# Patient Record
Sex: Female | Born: 1980 | Race: Black or African American | Hispanic: No | Marital: Single | Smoking: Never smoker
Health system: Southern US, Community
[De-identification: ages and names within clinical notes are randomized; demographics above are authoritative.]

## PROBLEM LIST (undated history)

## (undated) DIAGNOSIS — D571 Sickle-cell disease without crisis: Secondary | ICD-10-CM

## (undated) DIAGNOSIS — I639 Cerebral infarction, unspecified: Secondary | ICD-10-CM

---

## 2007-11-23 LAB — RETICULOCYTE COUNT: Reticulocyte count: 10.5 % — ABNORMAL HIGH (ref 0.5–1.5)

## 2007-11-24 LAB — BLOOD TYPE, (ABO+RH)
ABO/Rh(D): A POS
ABO/Rh: A POS

## 2007-11-24 LAB — BETA HCG, QT
Beta HCG, QT: 18061 m[IU]/mL — ABNORMAL HIGH (ref 0–6)
hCG Quant: 18061 m[IU]/mL — ABNORMAL HIGH (ref 0–6)

## 2007-11-24 LAB — CBC WITH AUTOMATED DIFF
ABS. BASOPHILS: 0 10*3/uL (ref 0.0–0.1)
ABS. EOSINOPHILS: 0.1 10*3/uL (ref 0.0–0.4)
ABS. LYMPHOCYTES: 4.8 10*3/uL — ABNORMAL HIGH (ref 0.8–3.5)
ABS. MONOCYTES: 1.4 10*3/uL — ABNORMAL HIGH (ref 0–1.0)
ABS. NEUTROPHILS: 6.2 10*3/uL (ref 1.8–8.0)
BASOPHILS: 0 % (ref 0–1)
EOSINOPHILS: 1 % (ref 0–7)
HCT: 20.7 % — ABNORMAL LOW (ref 35.0–47.0)
HGB: 6.9 g/dL — CL (ref 11.5–16.0)
LYMPHOCYTES: 38 % (ref 12–49)
MCH: 31.1 PG (ref 26.0–34.0)
MCHC: 33.3 g/dL (ref 30.0–35.0)
MCV: 93.2 FL (ref 80.0–99.0)
MONOCYTES: 11 % (ref 5–13)
NEUTROPHILS: 50 % (ref 32–75)
PLATELET: 661 10*3/uL — ABNORMAL HIGH (ref 150–400)
RBC: 2.22 M/uL — ABNORMAL LOW (ref 3.80–5.20)
RDW: 17.3 % — ABNORMAL HIGH (ref 11.5–14.5)
WBC: 12.5 10*3/uL — ABNORMAL HIGH (ref 3.6–11.0)

## 2007-11-24 LAB — METABOLIC PANEL, COMPREHENSIVE
A-G Ratio: 1 — ABNORMAL LOW (ref 1.1–2.2)
ALT (SGPT): 43 U/L (ref 30–65)
AST (SGOT): 43 U/L — ABNORMAL HIGH (ref 15–37)
Albumin: 4.4 g/dL (ref 3.5–5.0)
Alk. phosphatase: 60 U/L (ref 50–136)
Anion gap: 10 (ref 5–15)
BUN/Creatinine ratio: 12 (ref 12–20)
BUN: 6 MG/DL (ref 6–20)
Bilirubin, total: 3.6 MG/DL — ABNORMAL HIGH (ref 0–1.0)
CO2: 26 MMOL/L (ref 21–32)
Calcium: 9.1 MG/DL (ref 8.5–10.1)
Chloride: 102 MMOL/L (ref 97–108)
Creatinine: 0.5 MG/DL — ABNORMAL LOW (ref 0.6–1.3)
GFR est AA: >60 mL/min/{1.73_m2} (ref >60)
GFR est non-AA: >60 mL/min/{1.73_m2} (ref >60)
Globulin: 4.3 g/dL — ABNORMAL HIGH (ref 2.0–4.0)
Glucose: 89 MG/DL (ref 65–105)
Potassium: 3.7 MMOL/L (ref 3.5–5.1)
Protein, total: 8.7 g/dL — ABNORMAL HIGH (ref 6.4–8.2)
Sodium: 138 MMOL/L (ref 136–145)

## 2007-11-24 LAB — KOH, OTHER SOURCES: KOH: NONE SEEN

## 2007-11-24 LAB — URINALYSIS W/MICROSCOPIC
Bilirubin: NEGATIVE
Glucose: NEGATIVE MG/DL
Ketone: NEGATIVE MG/DL
Nitrites: NEGATIVE
Protein: NEGATIVE MG/DL
Specific gravity: 1.011 (ref 1.003–1.030)
Urobilinogen: 1 EU/DL (ref 0.2–1.0)
pH (UA): 7 (ref 5.0–8.0)

## 2007-11-24 LAB — WET PREP
Clue cells: ABSENT
Wet prep: NONE SEEN

## 2007-11-24 LAB — TYPE + CROSSMATCH
ABO/Rh(D): A POS
Antibody screen: NEGATIVE
Physician instructions: NEGATIVE

## 2007-11-24 LAB — PROTHROMBIN TIME + INR
INR: 1.2 (ref 0.9–1.2)
Prothrombin time: 13.1 SECS (ref 9.5–13.1)

## 2007-11-24 LAB — PTT, ACTIVATED: aPTT: 30.1 s (ref 26.5–37.3)

## 2007-11-24 LAB — HCG QL SERUM: HCG, Ql.: POSITIVE — AB

## 2016-07-27 ENCOUNTER — Emergency Department: Admit: 2016-07-27 | Payer: MEDICARE | Primary: Internal Medicine

## 2016-07-27 ENCOUNTER — Inpatient Hospital Stay
Admit: 2016-07-27 | Discharge: 2016-07-28 | Disposition: A | Discharge status: Home / Self Care | Payer: MEDICARE | Attending: Emergency Medicine

## 2016-07-27 DIAGNOSIS — T148 Other injury of unspecified body region: Secondary | ICD-10-CM

## 2016-07-27 LAB — CBC WITH AUTOMATED DIFF
ABS. BASOPHILS: 0 10*3/uL (ref 0.0–0.1)
ABS. EOSINOPHILS: 0.1 10*3/uL (ref 0.0–0.4)
ABS. LYMPHOCYTES: 1.9 10*3/uL (ref 0.8–3.5)
ABS. MONOCYTES: 1.3 10*3/uL — ABNORMAL HIGH (ref 0.0–1.0)
ABS. NEUTROPHILS: 6.2 10*3/uL (ref 1.8–8.0)
BASOPHILS: 0 % (ref 0–1)
EOSINOPHILS: 2 % (ref 0–7)
HCT: 24.8 % — ABNORMAL LOW (ref 35.0–47.0)
HGB: 8.4 g/dL — ABNORMAL LOW (ref 11.5–16.0)
LYMPHOCYTES: 20 % (ref 12–49)
MCH: 30.1 PG (ref 26.0–34.0)
MCHC: 33.9 g/dL (ref 30.0–36.5)
MCV: 88.9 FL (ref 80.0–99.0)
MONOCYTES: 13 % (ref 5–13)
NEUTROPHILS: 65 % (ref 32–75)
PLATELET: 584 10*3/uL — ABNORMAL HIGH (ref 150–400)
RBC: 2.79 M/uL — ABNORMAL LOW (ref 3.80–5.20)
RDW: 16.2 % — ABNORMAL HIGH (ref 11.5–14.5)
WBC: 9.6 10*3/uL (ref 3.6–11.0)

## 2016-07-27 LAB — HCG URINE, QL. - POC: Pregnancy test,urine (POC): NEGATIVE

## 2016-07-27 MED ORDER — SODIUM CHLORIDE 0.9% BOLUS IV
0.9 % | Freq: Once | INTRAVENOUS | Status: AC
Start: 2016-07-27 — End: 2016-07-27
  Administered 2016-07-27: via INTRAVENOUS

## 2016-07-27 MED ORDER — KETOROLAC TROMETHAMINE 30 MG/ML INJECTION
30 mg/mL (1 mL) | INTRAMUSCULAR | Status: AC
Start: 2016-07-27 — End: 2016-07-27
  Administered 2016-07-27: via INTRAVENOUS

## 2016-07-27 MED FILL — KETOROLAC TROMETHAMINE 30 MG/ML INJECTION: 30 mg/mL (1 mL) | INTRAMUSCULAR | Qty: 1

## 2016-07-27 MED FILL — SODIUM CHLORIDE 0.9 % IV: INTRAVENOUS | Qty: 1000

## 2016-07-27 NOTE — ED Notes (Signed)
IV fluid infusion completed at this time.  Patient tolerated well.
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
## 2016-07-27 NOTE — ED Notes (Signed)
Patient resting on stretcher in no distress.  Patient states pain is better since medication administration.  IV fluids continue infusing via gravity without difficulty as ordered. Call bell remains in reach.  Will continue to monitor.
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
## 2016-07-27 NOTE — ED Notes (Signed)
The patient was discharged home by ER MD and Nurse in stable condition, accompanied by parent/guardian . The patient is alert and oriented, is in no respiratory distress and has vital signs within normal limits . The patient's diagnosis, condition and treatment were explained to patient or parent/guardian. The patient/responsible party expressed understanding.  prescriptions given to pt. No work/school note given to pt. A discharge plan has been developed. A case manager was not involved in the process. Aftercare instructions were given to the patient.

## 2016-07-27 NOTE — ED Notes (Signed)
IV access established and blood samples sent to lab for testing as ordered.  Patient tolerated procedure well.  Patient medicated for pain and IV fluids infusing via gravity without difficulty as ordered.  Lights dimmed for comfort.  Patient updated regarding plan of care and associated time constraints.  Patient verbalizes understanding and agreement to current care plan.  Call bell in reach.  Will continue to monitor.

## 2016-07-28 LAB — METABOLIC PANEL, COMPREHENSIVE
A-G Ratio: 0.9 — ABNORMAL LOW (ref 1.1–2.2)
ALT (SGPT): 22 U/L (ref 12–78)
AST (SGOT): 25 U/L (ref 15–37)
Albumin: 3.8 g/dL (ref 3.5–5.0)
Alk. phosphatase: 69 U/L (ref 45–117)
Anion gap: 6 mmol/L (ref 5–15)
BUN/Creatinine ratio: 12 (ref 12–20)
BUN: 8 MG/DL (ref 6–20)
Bilirubin, total: 4.3 MG/DL — ABNORMAL HIGH (ref 0.2–1.0)
CO2: 28 mmol/L (ref 21–32)
Calcium: 8.5 MG/DL (ref 8.5–10.1)
Chloride: 104 mmol/L (ref 97–108)
Creatinine: 0.67 MG/DL (ref 0.55–1.02)
GFR est AA: >60 mL/min/{1.73_m2} (ref >60)
GFR est non-AA: >60 mL/min/{1.73_m2} (ref >60)
Globulin: 4.3 g/dL — ABNORMAL HIGH (ref 2.0–4.0)
Glucose: 98 mg/dL (ref 65–100)
Potassium: 3.9 mmol/L (ref 3.5–5.1)
Protein, total: 8.1 g/dL (ref 6.4–8.2)
Sodium: 138 mmol/L (ref 136–145)

## 2016-07-28 LAB — RETICULOCYTE COUNT: Reticulocyte count: 5.7 % — ABNORMAL HIGH (ref 0.7–2.1)

## 2016-07-28 MED ORDER — IBUPROFEN 600 MG TAB
600 mg | ORAL_TABLET | Freq: Three times a day (TID) | ORAL | 0 refills | Status: DC | PRN
Start: 2016-07-28 — End: 2017-05-28

## 2017-05-28 ENCOUNTER — Observation Stay

## 2017-05-28 ENCOUNTER — Observation Stay: Admit: 2017-05-28 | Payer: MEDICARE | Primary: Internal Medicine

## 2017-05-28 ENCOUNTER — Emergency Department: Admit: 2017-05-28 | Payer: MEDICARE | Primary: Internal Medicine

## 2017-05-28 ENCOUNTER — Inpatient Hospital Stay
Admit: 2017-05-28 | Discharge: 2017-05-28 | Disposition: A | Discharge status: Home / Self Care | Payer: MEDICARE | Attending: Emergency Medicine

## 2017-05-28 DIAGNOSIS — G459 Transient cerebral ischemic attack, unspecified: Secondary | ICD-10-CM

## 2017-05-28 LAB — EKG 12-LEAD
Atrial Rate: 82 {beats}/min
Diagnosis: NORMAL
P Axis: 40 degrees
P-R Interval: 162 ms
Q-T Interval: 362 ms
QRS Duration: 80 ms
QTc Calculation (Bazett): 422 ms
R Axis: 21 degrees
T Axis: 25 degrees
Ventricular Rate: 82 {beats}/min

## 2017-05-28 LAB — METABOLIC PANEL, COMPREHENSIVE
A-G Ratio: 0.9 — ABNORMAL LOW (ref 1.1–2.2)
ALT (SGPT): 30 U/L (ref 12–78)
AST (SGOT): 33 U/L (ref 15–37)
Albumin: 4.1 g/dL (ref 3.5–5.0)
Alk. phosphatase: 64 U/L (ref 45–117)
Anion gap: 13 mmol/L (ref 5–15)
BUN/Creatinine ratio: 7 — ABNORMAL LOW (ref 12–20)
BUN: 4 MG/DL — ABNORMAL LOW (ref 6–20)
Bilirubin, total: 4 MG/DL — ABNORMAL HIGH (ref 0.2–1.0)
CO2: 23 mmol/L (ref 21–32)
Calcium: 9 MG/DL (ref 8.5–10.1)
Chloride: 103 mmol/L (ref 97–108)
Creatinine: 0.61 MG/DL (ref 0.55–1.02)
GFR est AA: >60 mL/min/{1.73_m2} (ref >60)
GFR est non-AA: >60 mL/min/{1.73_m2} (ref >60)
Globulin: 4.8 g/dL — ABNORMAL HIGH (ref 2.0–4.0)
Glucose: 103 mg/dL — ABNORMAL HIGH (ref 65–100)
Potassium: 3.9 mmol/L (ref 3.5–5.1)
Protein, total: 8.9 g/dL — ABNORMAL HIGH (ref 6.4–8.2)
Sodium: 139 mmol/L (ref 136–145)

## 2017-05-28 LAB — URINALYSIS W/ REFLEX CULTURE
Bilirubin: NEGATIVE
Blood: NEGATIVE
Glucose: NEGATIVE mg/dL
Ketone: NEGATIVE mg/dL
Nitrites: NEGATIVE
Protein: NEGATIVE mg/dL
Specific gravity: 1.005 (ref 1.003–1.030)
Urobilinogen: 0.2 EU/dL (ref 0.2–1.0)
pH (UA): 7 (ref 5.0–8.0)

## 2017-05-28 LAB — CBC WITH AUTOMATED DIFF
ABS. BASOPHILS: 0 10*3/uL (ref 0.0–0.1)
ABS. EOSINOPHILS: 0.1 10*3/uL (ref 0.0–0.4)
ABS. LYMPHOCYTES: 1.3 10*3/uL (ref 0.8–3.5)
ABS. MONOCYTES: 1.2 10*3/uL — ABNORMAL HIGH (ref 0.0–1.0)
ABS. NEUTROPHILS: 6.8 10*3/uL (ref 1.8–8.0)
BASOPHILS: 0 % (ref 0–1)
EOSINOPHILS: 1 % (ref 0–7)
HCT: 27.1 % — ABNORMAL LOW (ref 35.0–47.0)
HGB: 9.5 g/dL — ABNORMAL LOW (ref 11.5–16.0)
LYMPHOCYTES: 14 % (ref 12–49)
MCH: 31.5 PG (ref 26.0–34.0)
MCHC: 35.1 g/dL (ref 30.0–36.5)
MCV: 89.7 FL (ref 80.0–99.0)
MONOCYTES: 13 % (ref 5–13)
MPV: 10.6 FL (ref 8.9–12.9)
NEUTROPHILS: 72 % (ref 32–75)
PLATELET: 402 10*3/uL — ABNORMAL HIGH (ref 150–400)
RBC: 3.02 M/uL — ABNORMAL LOW (ref 3.80–5.20)
RDW: 18 % — ABNORMAL HIGH (ref 11.5–14.5)
WBC: 9.4 10*3/uL (ref 3.6–11.0)
XXWBCSUS: 0

## 2017-05-28 LAB — EKG, 12 LEAD, INITIAL
Atrial Rate: 82 {beats}/min
Calculated P Axis: 40 degrees
Calculated R Axis: 21 degrees
Calculated T Axis: 25 degrees
Diagnosis: NORMAL
P-R Interval: 162 ms
Q-T Interval: 362 ms
QRS Duration: 80 ms
QTC Calculation (Bezet): 422 ms
Ventricular Rate: 82 {beats}/min

## 2017-05-28 LAB — PTT: aPTT: 23 s (ref 22.1–32.0)

## 2017-05-28 LAB — POC CHEM8
Anion gap (POC): 17 mmol/L (ref 10–20)
BUN (POC): 4 mg/dL — ABNORMAL LOW (ref 9–20)
CO2 (POC): 24 mmol/L (ref 21–32)
Calcium, ionized (POC): 1.24 mmol/L (ref 1.12–1.32)
Chloride (POC): 105 mmol/L (ref 98–107)
Creatinine (POC): 0.5 mg/dL — ABNORMAL LOW (ref 0.6–1.3)
GFRAA, POC: >60 mL/min/{1.73_m2} (ref >60)
GFRNA, POC: >60 mL/min/{1.73_m2} (ref >60)
Glucose (POC): 104 mg/dL — ABNORMAL HIGH (ref 65–100)
Hematocrit (POC): 31 % — ABNORMAL LOW (ref 35.0–47.0)
Potassium (POC): 4.1 mmol/L (ref 3.5–5.1)
Sodium (POC): 141 mmol/L (ref 136–145)

## 2017-05-28 LAB — PROTHROMBIN TIME + INR
INR: 1.1 (ref 0.9–1.1)
Prothrombin time: 10.5 s (ref 9.0–11.1)

## 2017-05-28 LAB — GLUCOSE, POC: Glucose (POC): 110 mg/dL — ABNORMAL HIGH (ref 65–100)

## 2017-05-28 LAB — TROPONIN I: Troponin-I, Qt.: <0.05 ng/mL (ref <0.05)

## 2017-05-28 LAB — POC TROPONIN-I: Troponin-I (POC): <0.04 ng/mL (ref 0.00–0.08)

## 2017-05-28 MED ORDER — HYDROMORPHONE 2 MG TAB
2 mg | ORAL | Status: DC | PRN
Start: 2017-05-28 — End: 2017-05-30
  Administered 2017-05-29 – 2017-05-30 (×4): via ORAL

## 2017-05-28 MED ORDER — OXYCODONE-ACETAMINOPHEN 5 MG-325 MG TAB
5-325 mg | ORAL | Status: DC | PRN
Start: 2017-05-28 — End: 2017-05-28

## 2017-05-28 MED ORDER — ASPIRIN 81 MG CHEWABLE TAB
81 mg | Freq: Every day | ORAL | Status: DC
Start: 2017-05-28 — End: 2017-05-30
  Administered 2017-05-29 – 2017-05-30 (×2): via ORAL

## 2017-05-28 MED ORDER — HYDROMORPHONE (PF) 2 MG/ML IJ SOLN
2 mg/mL | INTRAMUSCULAR | Status: DC | PRN
Start: 2017-05-28 — End: 2017-05-29
  Administered 2017-05-28 – 2017-05-29 (×4): via INTRAVENOUS

## 2017-05-28 MED ORDER — SODIUM CHLORIDE 0.9 % IJ SYRG
INTRAMUSCULAR | Status: DC | PRN
Start: 2017-05-28 — End: 2017-05-30

## 2017-05-28 MED ORDER — PROCHLORPERAZINE EDISYLATE 5 MG/ML INJECTION
5 mg/mL | Freq: Four times a day (QID) | INTRAMUSCULAR | Status: DC | PRN
Start: 2017-05-28 — End: 2017-05-30

## 2017-05-28 MED ORDER — LABETALOL 5 MG/ML IV SOLN
5 mg/mL | INTRAVENOUS | Status: DC | PRN
Start: 2017-05-28 — End: 2017-05-30

## 2017-05-28 MED ORDER — SODIUM CHLORIDE 0.9 % IJ SYRG
Freq: Three times a day (TID) | INTRAMUSCULAR | Status: DC
Start: 2017-05-28 — End: 2017-05-30
  Administered 2017-05-28 – 2017-05-30 (×6): via INTRAVENOUS

## 2017-05-28 MED ORDER — HYDROXYUREA 500 MG CAPSULE
500 mg | Freq: Every day | ORAL | Status: DC
Start: 2017-05-28 — End: 2017-05-30
  Administered 2017-05-29 – 2017-05-30 (×3): via ORAL

## 2017-05-28 MED ORDER — SODIUM CHLORIDE 0.9 % IV
INTRAVENOUS | Status: DC
Start: 2017-05-28 — End: 2017-05-30
  Administered 2017-05-28 – 2017-05-29 (×4): via INTRAVENOUS

## 2017-05-28 MED ORDER — ASPIRIN 81 MG CHEWABLE TAB
81 mg | ORAL | Status: AC
Start: 2017-05-28 — End: 2017-05-28
  Administered 2017-05-28: 18:00:00 via ORAL

## 2017-05-28 MED ORDER — ZOLPIDEM 5 MG TAB
5 mg | Freq: Every evening | ORAL | Status: DC | PRN
Start: 2017-05-28 — End: 2017-05-30

## 2017-05-28 MED ORDER — FOLIC ACID 1 MG TAB
1 mg | Freq: Every day | ORAL | Status: DC
Start: 2017-05-28 — End: 2017-05-30
  Administered 2017-05-28 – 2017-05-30 (×3): via ORAL

## 2017-05-28 MED ORDER — ACETAMINOPHEN 325 MG TABLET
325 mg | ORAL | Status: DC | PRN
Start: 2017-05-28 — End: 2017-05-30

## 2017-05-28 MED ORDER — ALPRAZOLAM 0.5 MG TAB
0.5 mg | Freq: Four times a day (QID) | ORAL | Status: DC | PRN
Start: 2017-05-28 — End: 2017-05-30
  Administered 2017-05-28: 22:00:00 via ORAL

## 2017-05-28 MED FILL — ALPRAZOLAM 0.5 MG TAB: 0.5 mg | ORAL | Qty: 1

## 2017-05-28 MED FILL — BD POSIFLUSH NORMAL SALINE 0.9 % INJECTION SYRINGE: INTRAMUSCULAR | Qty: 10

## 2017-05-28 MED FILL — HYDROXYUREA 500 MG CAPSULE: 500 mg | ORAL | Qty: 3

## 2017-05-28 MED FILL — SODIUM CHLORIDE 0.9 % IV: INTRAVENOUS | Qty: 1000

## 2017-05-28 MED FILL — ASPIRIN 81 MG CHEWABLE TAB: 81 mg | ORAL | Qty: 1

## 2017-05-28 MED FILL — FOLIC ACID 1 MG TAB: 1 mg | ORAL | Qty: 1

## 2017-05-28 MED FILL — HYDROMORPHONE (PF) 2 MG/ML IJ SOLN: 2 mg/mL | INTRAMUSCULAR | Qty: 1

## 2017-05-28 NOTE — ED Notes (Signed)
ED EKG Interpretation:  Rhythm: normal sinus rhythm and regular . Rate (approx.): 82; Axis: normal; Intervals: normal; ST/T wave: no acute ST/T wave changes.  Note written by Lanell MatarSarah J. Dash, Scribe, as dictated by Cindi CarbonGary Lennie Vasco, MD 12:17 PM    CONSULT NOTE:  12:18 PM Cindi CarbonGary Juri Dinning, MD spoke with Dr. Verlin FesterBeveridge, consult for Hospitalist, via [facility]. Discussed available diagnostic tests and clinical findings.     Dr. Verlin FesterBeveridge will evaluate the patient for possible admission.

## 2017-05-28 NOTE — ED Notes (Signed)
Stroke Education provided to patient and the following topics were discussed    1. Patients personal risk factors for stroke are sickle cell    2. Warning signs of Stroke:        * Sudden numbness or weakness of the face, arm or leg, especially on one side of          The body            * Sudden confusion, trouble speaking or understanding        * Sudden trouble seeing in one or both eyes        * Sudden trouble walking, dizziness, loss of balance or coordination        * Sudden severe headache with no known cause      3. Importance of activation Emergency Medical Services ( 9-1-1 ) immediately if experience any warning signs of stroke.    4. Be sure and schedule a follow-up appointment with your primary care doctor or any specialists as instructed.     5. You must take medicine every day to treat your risk factors for stroke.  Be sure to take your medicines exactly as your doctor tells you: no more, no less.  Know what your medicines are for , what they do.  Anti-thrombotics /anticoagulants can help prevent strokes.      6.  Smoking and second-hand smoke greatly increase your risk of stroke, cardiovascular disease and death.     7. Information provided was Stroke binder

## 2017-05-28 NOTE — ED Notes (Signed)
Pt eating graham crackers and drinking ginger ale

## 2017-05-28 NOTE — ED Notes (Signed)
[facility] communication provided and informed of ???purposeful rounding??? to include collaboration of entire care team; patient acknowledged understanding. IVF infusing without difficulty. Call bell within reach. Additional warm blankets given for comfort. Awaiting [facility]. Pt remains on cardiac monitor x 4. No numbness or tingling at this time.

## 2017-05-28 NOTE — Progress Notes (Signed)
Duplex carotid bilateral completed. The final result to follow.

## 2017-05-28 NOTE — ED Notes (Signed)
Time out: [facility]  PT, [facility], and family aware of [facility]. Pt has no numbness or tingling at this time.  Transfer Assessment: Patient A&O x 4 and in no distress. Physical re-examination reveals  improvement in pt???s condition with reassessment of vital signs completed at the time of admission transfer.

## 2017-05-28 NOTE — ED Triage Notes (Addendum)
Woke up around 0930 with right arm and leg having numbness and tingling. Hx of stroke 2010. Symptoms have improved. "symptoms have improved." hx of sickle cell. No vision changes. Pt went to bed at 0300 and was fine at that time. Pt is able to walk and has no weakness.

## 2017-05-28 NOTE — ED Provider Notes (Addendum)
HPI Comments: The patient has a history of sickle cell anemia and has had a previous stroke. She woke up at 9:30 this morning and noted numbness of her right arm a hand within a few minutes developed numbness of the right leg. She denies having any weakness. She denies headache, speech abnormality, blurred vision, gait abnormality or other complaints. She went to bed at 3 AM with no symptoms.    Patient is a 36 y.o. female presenting with tingling.   Tingling   Pertinent negatives include no fever, no headaches, no cough, no wheezing, no chest pain, no vomiting, no abdominal pain, no rash and no leg swelling.        Past Medical History:   Diagnosis Date   ??? Sickle cell anemia (HCC)    ??? Stroke (HCC) 2010       Past Surgical History:   Procedure Laterality Date   ??? HX CESAREAN SECTION           History reviewed. No pertinent family history.    Social History     Social History   ??? Marital status: SINGLE     Spouse name: N/A   ??? Number of children: N/A   ??? Years of education: N/A     Occupational History   ??? Not on file.     Social History Main Topics   ??? Smoking status: Never Smoker   ??? Smokeless tobacco: Never Used   ??? Alcohol use Yes      Comment: socially   ??? Drug use: No   ??? Sexual activity: Not on file     Other Topics Concern   ??? Not on file     Social History Narrative         ALLERGIES: Contrast agent [iodine]; Multihance [gadobenate dimeglumine]; and Pork derived (porcine)    Review of Systems   Constitutional: Negative for fever.   Eyes: Negative for visual disturbance.   Respiratory: Negative for cough, shortness of breath and wheezing.    Cardiovascular: Negative for chest pain and leg swelling.   Gastrointestinal: Negative for abdominal pain, diarrhea, nausea and vomiting.   Genitourinary: Negative for dysuria.   Musculoskeletal: Negative.  Negative for back pain, gait problem and neck stiffness.   Skin: Negative for rash.   Neurological: Positive for tingling and numbness. Negative for syncope,  weakness and headaches.   Psychiatric/Behavioral: Negative for confusion.   All other systems reviewed and are negative.      There were no vitals filed for this visit.         Physical Exam   Constitutional: She is oriented to person, place, and time. She appears well-developed and well-nourished. No distress.   HENT:   Head: Normocephalic.   Eyes: EOM are normal. Pupils are equal, round, and reactive to light.   Neck: Normal range of motion. Neck supple.   Cardiovascular: Normal rate and regular rhythm.    No murmur heard.  Pulmonary/Chest: Effort normal and breath sounds normal. No respiratory distress.   Abdominal: Soft. There is no tenderness.   Musculoskeletal: Normal range of motion. She exhibits no edema.   Neurological: She is alert and oriented to person, place, and time. She has normal strength. No cranial nerve deficit.   No drift. Nl finger nose. No weakness. + altered sensation of arms and legs   Skin: Skin is warm and dry.   Psychiatric: She has a normal mood and affect. Her behavior is normal.   Nursing note  and vitals reviewed.       MDM  Number of Diagnoses or Management Options  Cerebrovascular accident (CVA), unspecified mechanism (HCC):      Amount and/or Complexity of Data Reviewed  Clinical lab tests: ordered and reviewed  Obtain history from someone other than the patient: yes  Discuss the patient with other providers: yes  Independent visualization of images, tracings, or specimens: yes    Risk of Complications, Morbidity, and/or Mortality  Presenting problems: high  Diagnostic procedures: high  Management options: high    Critical Care  Total time providing critical care: 30-74 minutes    Patient Progress  Patient progress: stable        ED Course       Procedures      1140 am- splke c dr. Emmie Niemanniwuora - she rec. Adm for cva eval. Pt not a tpa candidate d/t time of onset not being clear.

## 2017-05-28 NOTE — ED Notes (Signed)
Dr. Taylor at bedside
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
## 2017-05-28 NOTE — Progress Notes (Addendum)
TRANSFER - IN REPORT:    Verbal report received from April Turpin(name) on Colleen Harvey  [facility] for routine progression of care      Report consisted of patient???s Situation, Background, Assessment and   Recommendations(SBAR).     Information from the following report(s) SBAR, Kardex, ED Summary, Intake/Output, Accordion and Recent Results was reviewed with the receiving nurse.    Opportunity for questions and clarification was provided.      Assessment completed upon patient???s arrival to unit and care assumed.       Primary Nurse Addison LankJillian Laurent, RN and Franchot HeidelbergIren Russell, RN performed a dual skin assessment on this patient No impairment noted  Braden score is 23      Bedside and Verbal shift change report given to Thomasene LotSuzy (Cabin crewoncoming nurse) by Valli GlanceJillian (offgoing nurse). Report included the following information SBAR, Kardex, Intake/Output, Accordion and Recent Results

## 2017-05-28 NOTE — Progress Notes (Addendum)
Bedside and Verbal shift change report given to Thomasene LotSuzy, Charity fundraiserN (Cabin crewoncoming nurse) by Valli GlanceJillian, RN (offgoing nurse). Report included the following information SBAR, Kardex, Accordion, Recent Results and Cardiac Rhythm NSR.     Pt out at procedure at shift change and returned to room at approx 2030.     2130:  Pt requesting IV pain meds (not PO) tonight for pain R hip.     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      It was brought to attn of RN by [employer] RN that a neuro consult had not been initiated.  0200:  Call made to Dr Karyl KinnierWehunt to advise.  MD stated to call neuro consult in AM.    Bedside and Verbal shift change report given to Valli GlanceJillian, RN (oncoming nurse) by Thomasene LotSuzy, RN (offgoing nurse). Report included the following information SBAR, Kardex, Accordion, Recent Results and Cardiac Rhythm NSR.   Notified Jillian about calling neuro consult.

## 2017-05-28 NOTE — ED Notes (Signed)
Pt states no numbness or tingling at this time.

## 2017-05-28 NOTE — Other (Signed)
TRANSFER - OUT REPORT:    Verbal report given to Yvetta CoderJill Laurent, RN on Colleen Harvey  [facility]/higher level of care       Report consisted of patient???s Situation, Background, Assessment and   Recommendations(SBAR).     Information from the following report(s) SBAR was reviewed with the receiving nurse.    Lines:   Peripheral IV 05/28/17 Right Hand (Active)   Site Assessment Clean, dry, & intact 05/28/2017 11:53 AM   Phlebitis Assessment 0 05/28/2017 11:53 AM   Infiltration Assessment 0 05/28/2017 11:53 AM   Dressing Status Clean, dry, & intact 05/28/2017 11:53 AM   Dressing Type Transparent 05/28/2017 11:53 AM   Hub Color/Line Status Yellow 05/28/2017 11:53 AM   Action Taken Blood drawn 05/28/2017 11:53 AM   Alcohol Cap Used Yes 05/28/2017 11:53 AM        Opportunity for questions and clarification was provided.      Patient transported with:   Clothes  Glasses  Cell  EMTALA

## 2017-05-28 NOTE — Consults (Signed)
See Admit H&P.

## 2017-05-29 LAB — ECHOCARDIOGRAM COMPLETE 2D W DOPPLER W COLOR: Left Ventricular Ejection Fraction: 63

## 2017-05-29 LAB — METABOLIC PANEL, BASIC
Anion gap: 9 mmol/L (ref 5–15)
BUN/Creatinine ratio: 9 — ABNORMAL LOW (ref 12–20)
BUN: 5 MG/DL — ABNORMAL LOW (ref 6–20)
CO2: 23 mmol/L (ref 21–32)
Calcium: 8.7 MG/DL (ref 8.5–10.1)
Chloride: 106 mmol/L (ref 97–108)
Creatinine: 0.53 MG/DL — ABNORMAL LOW (ref 0.55–1.02)
GFR est AA: >60 mL/min/{1.73_m2} (ref >60)
GFR est non-AA: >60 mL/min/{1.73_m2} (ref >60)
Glucose: 94 mg/dL (ref 65–100)
Potassium: 4.1 mmol/L (ref 3.5–5.1)
Sodium: 138 mmol/L (ref 136–145)

## 2017-05-29 LAB — CBC W/O DIFF
ABSOLUTE NRBC: 0.04 10*3/uL — ABNORMAL HIGH (ref 0.00–0.01)
HCT: 24.3 % — ABNORMAL LOW (ref 35.0–47.0)
HGB: 8.2 g/dL — ABNORMAL LOW (ref 11.5–16.0)
MCH: 30.7 PG (ref 26.0–34.0)
MCHC: 33.7 g/dL (ref 30.0–36.5)
MCV: 91 FL (ref 80.0–99.0)
MPV: 10.5 FL (ref 8.9–12.9)
NRBC: 0.4 PER 100 WBC — ABNORMAL HIGH
PLATELET: 354 10*3/uL (ref 150–400)
RBC: 2.67 M/uL — ABNORMAL LOW (ref 3.80–5.20)
RDW: 17.8 % — ABNORMAL HIGH (ref 11.5–14.5)
WBC: 9.6 10*3/uL (ref 3.6–11.0)

## 2017-05-29 LAB — LIPID PANEL
CHOL/HDL Ratio: 4.1 (ref 0–5.0)
Cholesterol, total: 112 MG/DL (ref <200)
HDL Cholesterol: 27 MG/DL
LDL, calculated: 55.6 MG/DL (ref 0–100)
Triglyceride: 147 MG/DL (ref <150)
VLDL, calculated: 29.4 MG/DL

## 2017-05-29 LAB — PHOSPHORUS: Phosphorus: 4 MG/DL (ref 2.6–4.7)

## 2017-05-29 LAB — HEMOGLOBIN A1C WITH EAG: Hemoglobin A1c: <3.5 % — ABNORMAL LOW (ref 4.2–6.3)

## 2017-05-29 LAB — MAGNESIUM: Magnesium: 1.7 mg/dL (ref 1.6–2.4)

## 2017-05-29 MED ORDER — FONDAPARINUX 7.5 MG/0.6 ML SUB-Q SYRINGE
7.5 mg/0.6 mL | Freq: Every day | SUBCUTANEOUS | Status: DC
Start: 2017-05-29 — End: 2017-05-30
  Administered 2017-05-29 – 2017-05-30 (×2): via SUBCUTANEOUS

## 2017-05-29 MED ORDER — POLYETHYLENE GLYCOL 3350 17 GRAM (100 %) ORAL POWDER PACKET
17 gram | Freq: Every day | ORAL | Status: DC
Start: 2017-05-29 — End: 2017-05-30
  Administered 2017-05-29 – 2017-05-30 (×2): via ORAL

## 2017-05-29 MED FILL — HYDROXYUREA 500 MG CAPSULE: 500 mg | ORAL | Qty: 3

## 2017-05-29 MED FILL — HYDROMORPHONE (PF) 2 MG/ML IJ SOLN: 2 mg/mL | INTRAMUSCULAR | Qty: 1

## 2017-05-29 MED FILL — FONDAPARINUX 7.5 MG/0.6 ML SUB-Q SYRINGE: 7.5 mg/0.6 mL | SUBCUTANEOUS | Qty: 0.6

## 2017-05-29 MED FILL — HYDROMORPHONE 2 MG TAB: 2 mg | ORAL | Qty: 8

## 2017-05-29 MED FILL — ASPIRIN 81 MG CHEWABLE TAB: 81 mg | ORAL | Qty: 1

## 2017-05-29 MED FILL — POLYETHYLENE GLYCOL 3350 17 GRAM (100 %) ORAL POWDER PACKET: 17 gram | ORAL | Qty: 1

## 2017-05-29 MED FILL — FOLIC ACID 1 MG TAB: 1 mg | ORAL | Qty: 1

## 2017-05-29 NOTE — Progress Notes (Signed)
[facility]  [address], [place], [place] [id number]  [phone]      Medical Progress Note      NAME: Colleen Harvey   DOB:  Jul 13, 1981  MRM:  [id number]    Date/Time: 05/29/2017  8:16 AM         Subjective:     Chief Complaint:  Numbness: resolved yesterday, no recurrence    ROS:  (bold if positive, if negative)                        Tolerating Diet          Objective:       Vitals:          Last 24hrs VS reviewed since prior progress note. Most recent are:    Visit Vitals   ??? BP (!) 85/51 (BP 1 Location: Right arm, BP Patient Position: At rest;Lying left side)   ??? Pulse 77   ??? Temp 98.4 ??F (36.9 ??C)   ??? Resp 17   ??? Ht 5\' 5"  (1.651 m)   ??? Wt 54.6 kg (120 lb 5.9 oz)   ??? SpO2 97%   ??? Breastfeeding No   ??? BMI 20.03 kg/m2     SpO2 Readings from Last 6 Encounters:   05/29/17 97%   07/27/16 96%        No intake or output data in the 24 hours ending 05/29/17 0816       Exam:     Physical Exam:    Gen:  Well-developed, well-nourished, in no acute distress  HEENT:  Pink conjunctivae, PERRL, hearing intact to voice, moist mucous membranes  Neck:  Supple, without masses, thyroid non-tender  Resp:  No accessory muscle use, clear breath sounds without wheezes rales or rhonchi  Card:  No murmurs, normal S1, S2 without thrills, bruits or peripheral edema, 2+ pedal pulses  Abd:  Soft, non-tender, non-distended, normoactive bowel sounds are present, no palpable organomegaly and no detectable hernias  Lymph:  No cervical or inguinal adenopathy  Musc:  No cyanosis or clubbing  Skin:  No rashes or ulcers, skin turgor is good, cap refill <2 sec  Neuro:  Cranial nerves are grossly intact, no focal motor weakness, follows commands appropriately  Psych:  Good insight, oriented to person, place and time, alert       Telemetry reviewed:   normal sinus rhythm    Medications Reviewed: (see below)    Lab Data Reviewed: (see below)    ______________________________________________________________________     Medications:     Current Facility-Administered Medications   Medication Dose Route Frequency   ??? labetalol (NORMODYNE;TRANDATE) injection 10 mg  10 mg IntraVENous Q4H PRN   ??? 0.9% sodium chloride infusion  50 mL/hr IntraVENous CONTINUOUS   ??? sodium chloride (NS) flush 5-10 mL  5-10 mL IntraVENous Q8H   ??? sodium chloride (NS) flush 5-10 mL  5-10 mL IntraVENous PRN   ??? acetaminophen (TYLENOL) tablet 650 mg  650 mg Oral Q4H PRN   ??? prochlorperazine (COMPAZINE) injection 10 mg  10 mg IntraVENous Q6H PRN   ??? zolpidem (AMBIEN) tablet 5 mg  5 mg Oral QHS PRN   ??? aspirin chewable tablet 81 mg  81 mg Oral DAILY   ??? HYDROmorphone (DILAUDID) tablet 16 mg  16 mg Oral Q4H PRN   ??? ALPRAZolam (XANAX) tablet 0.5 mg  0.5 mg Oral QID PRN   ??? hydroxyurea (HYDREA) chemo  cap 1,500 mg  1,500 mg Oral DAILY   ??? folic acid (FOLVITE) tablet 1 mg  1 mg Oral DAILY            Lab Review:     Recent Labs      05/29/17   0132  05/28/17   1154   WBC  9.6  9.4   HGB  8.2*  9.5*   HCT  24.3*  27.1*   PLT  354  402*     Recent Labs      05/29/17   0132  05/28/17   1154   NA  138  139   K  4.1  3.9   CL  106  103   CO2  23  23   GLU  94  103*   BUN  5*  4*   CREA  0.53*  0.61   CA  8.7  9.0   MG  1.7   --    PHOS  4.0   --    ALB   --   4.1   TBILI   --   4.0*   SGOT   --   33   ALT   --   30   INR   --   1.1     Lab Results   Component Value Date/Time    Glucose (POC) 104 (H) 05/28/2017 11:33 AM    Glucose (POC) 110 (H) 05/28/2017 11:22 AM     No results for input(s): PH, PCO2, PO2, HCO3, FIO2 in the last 72 hours.  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   - consult Vascular   - continue ASA   - await echo and lipid panel   - Neurology to see    Active Problems:    History of stroke (05/28/2017)   - as above, continue ASA   - was previously anticoagulated and then on regimen of exchange transfusion      Sickle cell disease (HCC) (05/28/2017)   - continue home regimen of oral pain meds   - IV dosing given overnight not effective due to inadequate dosing      Hyperglycemia (05/28/2017)   - A1c meaningless in the setting of rapid RBC destruction   - BS okay this AM      Total time spent in patient care: 35 minutes                  Care Plan discussed with: Patient, Family and Nursing Staff    Discussed:  Code Status, Care Plan and D/C Planning    Prophylaxis:  SCD's    Disposition:  Home w/Family  ___________________________________________________    Attending Physician: Coralyn Pear, MD

## 2017-05-29 NOTE — Progress Notes (Addendum)
SHIFT CHANGE:  1930 Bedside and Verbal shift change report given to Amy RN (oncoming nurse) by Valli GlanceJillian (offgoing nurse). Report included the following information SBAR, Kardex, MAR and Recent Results.     SHIFT SUMMARY:  2154  Patient given pain medicine as prescribed for reported 7/10 pain in hip.  0100  NO LAB WORK ORDERED for this am.        END OF SHIFT REPORT:  0730  Bedside and Verbal shift change report given to Jillian (oncoming nurse) by Linton RumpAmy (offgoing nurse). Report included the following information SBAR, Kardex, MAR and Recent Results.

## 2017-05-29 NOTE — Progress Notes (Signed)
Echo completed report to follow.

## 2017-05-29 NOTE — Progress Notes (Addendum)
Bedside and Verbal shift change report given to Jillian (Cabin crewoncoming nurse) by Thomasene LotSuzy (offgoing nurse). Report included the following information SBAR, Kardex, Intake/Output, Accordion and Recent Results.     Bedside and Verbal shift change report given to Amy (oncoming nurse) by Valli GlanceJillian (offgoing nurse). Report included the following information SBAR, Kardex and Accordion.     Tiger text dr beveridge to let him know that according to dr. Delorse LekPadgett no iv contrast will be administered because of anaphylactic reaction. No ct orders in at this time

## 2017-05-29 NOTE — Progress Notes (Signed)
Problem: Falls - Risk of  Goal: *Absence of Falls  Document Schmid Fall Risk and appropriate interventions in the flowsheet.   Outcome: Progressing Towards Goal  Fall Risk Interventions:            Medication Interventions: Teach patient to arise slowly

## 2017-05-29 NOTE — Consults (Signed)
Vascular Surgery Consult    Subjective:      Colleen Harvey is a 36 y.o. female who presents for right arm and leg numbness.  She states yesterday she was getting up from bed and noted her right hand and foot felt like it was asleep.  She moved around and it never improved so [facility].  When she arrived her symptoms essentially resolved and she currently has no further numbness.  She has a signficant history of sickle cell and has had a previous stroke 7-8 years ago affecting her right side as well. She recalls it being due to her sickle cell disease.  She has no known history of carotid disease and doesn't remember anything mentioned from that time.  As part of her stroke workup she had an MRI which showed a distal left ICA occlusion.  This was confirmed on duplex.  Vascular is consulted for evaluation.     Past Medical History:   Diagnosis Date   ??? Sickle cell anemia (HCC)    ??? Stroke [facility](HCC) 2010     Past Surgical History:   Procedure Laterality Date   ??? HX CESAREAN SECTION        Family History   Problem Relation Age of Onset   ??? Hypertension Mother    ??? Diabetes Father      Social History     Social History   ??? Marital status: SINGLE     Spouse name: N/A   ??? Number of children: N/A   ??? Years of education: N/A     Social History Main Topics   ??? Smoking status: Never Smoker   ??? Smokeless tobacco: Never Used   ??? Alcohol use Yes      Comment: socially   ??? Drug use: No   ??? Sexual activity: Not Asked     Other Topics Concern   ??? None     Social History Narrative      Current Facility-Administered Medications   Medication Dose Route Frequency Provider Last Rate Last Dose   ??? fondaparinux (ARIXTRA) injection 7.5 mg  7.5 mg SubCUTAneous DAILY Marijo Filelay Beveridge, MD   7.5 mg at 05/29/17 1219   ??? labetalol (NORMODYNE;TRANDATE) injection 10 mg  10 mg IntraVENous Q4H PRN Marijo Filelay Beveridge, MD       ??? 0.9% sodium chloride infusion  50 mL/hr IntraVENous CONTINUOUS Marijo Filelay  Beveridge, MD 50 mL/hr at 05/28/17 1717 50 mL/hr at 05/28/17 1717   ??? sodium chloride (NS) flush 5-10 mL  5-10 mL IntraVENous Q8H Marijo Filelay Beveridge, MD   10 mL at 05/29/17 0908   ??? sodium chloride (NS) flush 5-10 mL  5-10 mL IntraVENous PRN Marijo Filelay Beveridge, MD       ??? acetaminophen (TYLENOL) tablet 650 mg  650 mg Oral Q4H PRN Marijo Filelay Beveridge, MD       ??? prochlorperazine (COMPAZINE) injection 10 mg  10 mg IntraVENous Q6H PRN Marijo Filelay Beveridge, MD       ??? zolpidem (AMBIEN) tablet 5 mg  5 mg Oral QHS PRN Marijo Filelay Beveridge, MD       ??? aspirin chewable tablet 81 mg  81 mg Oral DAILY Marijo Filelay Beveridge, MD   81 mg at 05/29/17 0908   ??? HYDROmorphone (DILAUDID) tablet 16 mg  16 mg Oral Q4H PRN Marijo Filelay Beveridge, MD   16 mg at 05/29/17 0932   ??? ALPRAZolam Prudy Feeler(XANAX) tablet 0.5 mg  0.5 mg Oral QID PRN Marijo Filelay Beveridge, MD  0.5 mg at 05/28/17 1811   ??? hydroxyurea (HYDREA) chemo cap 1,500 mg  1,500 mg Oral DAILY Marijo Filelay Beveridge, MD   1,500 mg at 05/29/17 0908   ??? folic acid (FOLVITE) tablet 1 mg  1 mg Oral DAILY Marijo Filelay Beveridge, MD   1 mg at 05/29/17 0908        Allergies   Allergen Reactions   ??? Contrast Agent [Iodine] Anaphylaxis     Iv dye   ??? Multihance [Gadobenate Dimeglumine] Anaphylaxis   ??? Pork Derived (Porcine) Other (comments)     Causes generalized pain         Review of Systems:  A comprehensive review of systems was negative except for that written in the History of Present Illness.    Objective:      Patient Vitals for the past 8 hrs:   BP Temp Pulse Resp SpO2   05/29/17 1059 102/62 98.8 ??F (37.1 ??C) 75 16 97 %   05/29/17 0736 (!) 85/51 98.4 ??F (36.9 ??C) 77 17 97 %   05/29/17 0657 - - 75 - -   05/29/17 0547 (!) 81/54 98.7 ??F (37.1 ??C) 83 16 96 %       Temp (24hrs), Avg:98.8 ??F (37.1 ??C), Min:98.3 ??F (36.8 ??C), Max:100 ??F (37.8 ??C)      Physical Exam:  GENERAL: alert, cooperative, no distress, appears stated age, LYMPHATIC: Cervical, supraclavicular, and axillary nodes normal. , LUNG: clear to  auscultation bilaterally, HEART: regular rate and rhythm, S1, S2 normal, no murmur, click, rub or gallop, ABDOMEN: soft, non-tender. Bowel sounds normal. No masses,  no organomegaly, EXTREMITIES:  extremities normal, atraumatic, no cyanosis or edema, SKIN: Normal., NEUROLOGIC: AOx3. Gait normal. Reflexes and motor strength normal and symmetric. Cranial nerves 2-12 and sensation grossly intact., PSYCHIATRIC: non focal    Assessment:     36 year old female with sickle cell disease with right sided TIA symptoms and left ICA occlusion    Plan:     Discussion with patient and Dr. Verlin FesterBeveridge.  ICA occlusion would be atypical for a 36 year old, regardless of her sickle cell diagnosis.  I recommended treating this is a possible dissection given her young age until we can rule it out.  She has throat swelling with IV contrast so we will need to premedicate.  I also recommended starting anticoagulation until we know more.  Likely no surgical intervention necessary but may change our medical management based on her CT scan.    Thanks for the consult, please call with questions.  I will touch base after the CT scan tomorrow.     Signed By: Pauline AusAndrew E Amariyon Maynes, MD     May 29, 2017
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
## 2017-05-29 NOTE — Consults (Signed)
Vascular Surgery Consult    Subjective:      Colleen Harvey is a 36 y.o. female who presents for right arm and leg numbness.  She states yesterday she was getting up from bed and noted her right hand and foot felt like it was asleep.  She moved around and it never improved so [facility].  When she arrived her symptoms essentially resolved and she currently has no further numbness.  She has a signficant history of sickle cell and has had a previous stroke 7-8 years ago affecting her right side as well. She recalls it being due to her sickle cell disease.  She has no known history of carotid disease and doesn't remember anything mentioned from that time.  As part of her stroke workup she had an MRI which showed a distal left ICA occlusion.  This was confirmed on duplex.  Vascular is consulted for evaluation.     Past Medical History:   Diagnosis Date   ??? Sickle cell anemia (H[facility])    ??? Stroke [facility]) 2010     Past Surgical History:   Procedure Laterality Date   ??? HX CESAREAN SECTION        Family History   Problem Relation Age of Onset   ??? Hypertension Mother    ??? Diabetes Father      Social History     Social History   ??? Marital status: SINGLE     Spouse name: N/A   ??? Number of children: N/A   ??? Years of education: N/A     Social History Main Topics   ??? Smoking status: Never Smoker   ??? Smokeless tobacco: Never Used   ??? Alcohol use Yes      Comment: socially   ??? Drug use: No   ??? Sexual activity: Not Asked     Other Topics Concern   ??? None     Social History Narrative      Current Facility-Administered Medications   Medication Dose Route Frequency Provider Last Rate Last Dose   ??? fondaparinux (ARIXTRA) injection 7.5 mg  7.5 mg SubCUTAneous DAILY Marijo Filelay Beveridge, MD   7.5 mg at 05/29/17 1219   ??? labetalol (NORMODYNE;TRANDATE) injection 10 mg  10 mg IntraVENous Q4H PRN Marijo Filelay Beveridge, MD       ??? 0.9% sodium chloride infusion  50 mL/hr IntraVENous CONTINUOUS Marijo Filelay Beveridge, MD 50 mL/hr at 05/28/17  1717 50 mL/hr at 05/28/17 1717   ??? sodium chloride (NS) flush 5-10 mL  5-10 mL IntraVENous Q8H Marijo Filelay Beveridge, MD   10 mL at 05/29/17 0908   ??? sodium chloride (NS) flush 5-10 mL  5-10 mL IntraVENous PRN Marijo Filelay Beveridge, MD       ??? acetaminophen (TYLENOL) tablet 650 mg  650 mg Oral Q4H PRN Marijo Filelay Beveridge, MD       ??? prochlorperazine (COMPAZINE) injection 10 mg  10 mg IntraVENous Q6H PRN Marijo Filelay Beveridge, MD       ??? zolpidem (AMBIEN) tablet 5 mg  5 mg Oral QHS PRN Marijo Filelay Beveridge, MD       ??? aspirin chewable tablet 81 mg  81 mg Oral DAILY Marijo Filelay Beveridge, MD   81 mg at 05/29/17 0908   ??? HYDROmorphone (DILAUDID) tablet 16 mg  16 mg Oral Q4H PRN Marijo Filelay Beveridge, MD   16 mg at 05/29/17 0932   ??? ALPRAZolam Prudy Feeler(XANAX) tablet 0.5 mg  0.5 mg Oral QID PRN Marijo Filelay Beveridge, MD  0.5 mg at 05/28/17 1811   ??? hydroxyurea (HYDREA) chemo cap 1,500 mg  1,500 mg Oral DAILY Marijo Filelay Beveridge, MD   1,500 mg at 05/29/17 0908   ??? folic acid (FOLVITE) tablet 1 mg  1 mg Oral DAILY Marijo Filelay Beveridge, MD   1 mg at 05/29/17 0908        Allergies   Allergen Reactions   ??? Contrast Agent [Iodine] Anaphylaxis     Iv dye   ??? Multihance [Gadobenate Dimeglumine] Anaphylaxis   ??? Pork Derived (Porcine) Other (comments)     Causes generalized pain         Review of Systems:  A comprehensive review of systems was negative except for that written in the History of Present Illness.    Objective:      Patient Vitals for the past 8 hrs:   BP Temp Pulse Resp SpO2   05/29/17 1059 102/62 98.8 ??F (37.1 ??C) 75 16 97 %   05/29/17 0736 (!) 85/51 98.4 ??F (36.9 ??C) 77 17 97 %   05/29/17 0657 - - 75 - -   05/29/17 0547 (!) 81/54 98.7 ??F (37.1 ??C) 83 16 96 %       Temp (24hrs), Avg:98.8 ??F (37.1 ??C), Min:98.3 ??F (36.8 ??C), Max:100 ??F (37.8 ??C)      Physical Exam:  GENERAL: alert, cooperative, no distress, appears stated age, LYMPHATIC: Cervical, supraclavicular, and axillary nodes normal. , LUNG: clear to auscultation bilaterally, HEART: regular rate and rhythm, S1, S2 normal, no  murmur, click, rub or gallop, ABDOMEN: soft, non-tender. Bowel sounds normal. No masses,  no organomegaly, EXTREMITIES:  extremities normal, atraumatic, no cyanosis or edema, SKIN: Normal., NEUROLOGIC: AOx3. Gait normal. Reflexes and motor strength normal and symmetric. Cranial nerves 2-12 and sensation grossly intact., PSYCHIATRIC: non focal    Assessment:     36 year old female with sickle cell disease with right sided TIA symptoms and left ICA occlusion    Plan:     Discussion with patient and Dr. Verlin FesterBeveridge.  ICA occlusion would be atypical for a 36 year old, regardless of her sickle cell diagnosis.  I recommended treating this is a possible dissection given her young age until we can rule it out.  She has throat swelling with IV contrast so we will need to premedicate.  I also recommended starting anticoagulation until we know more.  Likely no surgical intervention necessary but may change our medical management based on her CT scan.    Thanks for the consult, please call with questions.  I will touch base after the CT scan tomorrow.     Signed By: Pauline AusAndrew E Jovana Rembold, MD     May 29, 2017
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
## 2017-05-30 LAB — CULTURE, URINE
Colonies Counted: >100000
Colony Count: >100000

## 2017-05-30 MED ORDER — APIXABAN 5 MG TABLET
5 mg | ORAL_TABLET | Freq: Two times a day (BID) | ORAL | 0 refills | Status: AC
Start: 2017-05-30 — End: ?

## 2017-05-30 MED FILL — HYDROXYUREA 500 MG CAPSULE: 500 mg | ORAL | Qty: 3

## 2017-05-30 MED FILL — HYDROMORPHONE 2 MG TAB: 2 mg | ORAL | Qty: 8

## 2017-05-30 MED FILL — FONDAPARINUX 7.5 MG/0.6 ML SUB-Q SYRINGE: 7.5 mg/0.6 mL | SUBCUTANEOUS | Qty: 0.6

## 2017-05-30 MED FILL — POLYETHYLENE GLYCOL 3350 17 GRAM (100 %) ORAL POWDER PACKET: 17 gram | ORAL | Qty: 1

## 2017-05-30 MED FILL — FOLIC ACID 1 MG TAB: 1 mg | ORAL | Qty: 1

## 2017-05-30 MED FILL — ASPIRIN 81 MG CHEWABLE TAB: 81 mg | ORAL | Qty: 1
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
## 2017-05-30 NOTE — Procedures (Signed)
[facility]  *** FINAL REPORT ***    Name: Raenette RoverMCCOY, Colleen  MRN: [id number]    Inpatient  DOB: 12 Jun 1981  HIS Order #: [id number]  TRAKnet Visit #: [id number]  Date: 28 May 2017    TYPE OF TEST: Cerebrovascular Duplex    REASON FOR TEST  Transient ischemic attacks    Right Carotid:-             Proximal               Mid                 Distal  cm/s  Systolic  Diastolic  Systolic  Diastolic  Systolic  Diastolic  CCA:    160.1      27.6                           125.2      32.3  Bulb:  ECA:     91.9      11.1  ICA:    146.1      36.9       96.8      43.5       98.3      41.8  ICA/CCA:  1.2       1.1    ICA Stenosis: <50%    Right Vertebral:-  Finding: Antegrade  Sys:       80.6  Dia:       35.4    Right Subclavian:    Left Carotid:-            Proximal                Mid                 Distal  cm/s  Systolic  Diastolic  Systolic  Diastolic  Systolic  Diastolic  CCA:    164.7       0.0                           118.2       0.0  Bulb:  ECA:     75.8      12.8  ICA:      0.0       0.0        0.0       0.0        0.0       0.0  ICA/CCA:  0.0       0.0    ICA Stenosis: Occluded    Left Vertebral:-  Finding: Antegrade  Sys:       79.9  Dia:       29.9    Left Subclavian:    INTERPRETATION/FINDINGS  PROCEDURE:  Evaluation of the extracranial cerebrovascular arteries  with ultrasound (B-mode imaging, pulsed Doppler, color Doppler).  Includes the common carotid, internal carotid, external carotid, and  vertebral arteries.    FINDINGS: Gray-scale and color flow duplex images of the extracranial,   carotid, and vertebral arteries were performed bilaterally. Gray  scale analysis shows an occluded internal carotid artery on the left  with mild plaquing in the 0% to 49% range on the right. Color flow  analysis shows decreased color flow in the areas of plaquing on the  left. Pulsed wave Doppler shows no focal velocity shifts.  Patent  vertebral arteries with normal antegrade bilaterally.    CONCLUSION:   1. Occluded left internal carotid artery without certainty of whether  it is acute or chronic.  2. 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  3. Patent vertebral arteries with antegrade flow bilaterally.    ADDITIONAL COMMENTS    I have personally reviewed the data relevant to the interpretation of  this  study.    TECHNOLOGIST: Rosalio MacadamiaYongdeok Bruce Shin  Signed: 05/28/2017 09:47 PM    PHYSICIAN: Vernia BuffMarc T. Sheliah HatchWarner, MD  Signed: 05/31/2017 07:19 AM

## 2017-05-30 NOTE — Progress Notes (Signed)
Bedside and Verbal shift change report given to Jillian (Cabin crewoncoming nurse) by Linton RumpAmy (offgoing nurse). Report included the following information SBAR, Kardex and Accordion.

## 2017-05-30 NOTE — Progress Notes (Signed)
[facility]  [address], [place], [place] [id number]  [phone]      Medical Progress Note      NAME: Colleen Harvey   DOB:  03/06/1981  MRM:  [id number]    Date/Time: 05/30/2017  8:15 AM          Subjective:     Chief Complaint:  Numbness: resolved PTA, no recurrence    ROS:  (bold if positive, if negative)                        Tolerating Diet          Objective:       Vitals:          Last 24hrs VS reviewed since prior progress note. Most recent are:    Visit Vitals   ??? BP 92/54 (BP 1 Location: Right arm, BP Patient Position: At rest)   ??? Pulse 78   ??? Temp 98.4 ??F (36.9 ??C)   ??? Resp 16   ??? Ht 5\' 5"  (1.651 m)   ??? Wt 54.6 kg (120 lb 5.9 oz)   ??? SpO2 94%   ??? Breastfeeding No   ??? BMI 20.03 kg/m2     SpO2 Readings from Last 6 Encounters:   05/30/17 94%   07/27/16 96%            Intake/Output Summary (Last 24 hours) at 05/30/17 0815  Last data filed at 05/30/17 0654   Gross per 24 hour   Intake              840 ml   Output                0 ml   Net              840 ml          Exam:     Physical Exam:    Gen:  Well-developed, well-nourished, in no acute distress  HEENT:  Pink conjunctivae, PERRL, hearing intact to voice, moist mucous membranes  Neck:  Supple, without masses, thyroid non-tender  Resp:  No accessory muscle use, clear breath sounds without wheezes rales or rhonchi  Card:  No murmurs, normal S1, S2 without thrills, bruits or peripheral edema, 2+ pedal pulses  Abd:  Soft, non-tender, non-distended, normoactive bowel sounds are present, no palpable organomegaly and no detectable hernias  Lymph:  No cervical or inguinal adenopathy  Musc:  No cyanosis or clubbing  Skin:  No rashes or ulcers, skin turgor is good, cap refill <2 sec  Neuro:  Cranial nerves are grossly intact, no focal motor weakness, follows commands appropriately  Psych:  Good insight, oriented to person, place and time, alert       Telemetry reviewed:   normal sinus rhythm     Medications Reviewed: (see below)    Lab Data Reviewed: (see below)    ______________________________________________________________________    Medications:     Current Facility-Administered Medications   Medication Dose Route Frequency   ??? fondaparinux (ARIXTRA) injection 7.5 mg  7.5 mg SubCUTAneous DAILY   ??? polyethylene glycol (MIRALAX) packet 17 g  17 g Oral DAILY   ??? labetalol (NORMODYNE;TRANDATE) injection 10 mg  10 mg IntraVENous Q4H PRN   ??? 0.9% sodium chloride infusion  50 mL/hr IntraVENous CONTINUOUS   ??? sodium chloride (NS) flush 5-10 mL  5-10 mL IntraVENous Q8H   ??? sodium chloride (NS)  flush 5-10 mL  5-10 mL IntraVENous PRN   ??? acetaminophen (TYLENOL) tablet 650 mg  650 mg Oral Q4H PRN   ??? prochlorperazine (COMPAZINE) injection 10 mg  10 mg IntraVENous Q6H PRN   ??? zolpidem (AMBIEN) tablet 5 mg  5 mg Oral QHS PRN   ??? aspirin chewable tablet 81 mg  81 mg Oral DAILY   ??? HYDROmorphone (DILAUDID) tablet 16 mg  16 mg Oral Q4H PRN   ??? ALPRAZolam (XANAX) tablet 0.5 mg  0.5 mg Oral QID PRN   ??? hydroxyurea (HYDREA) chemo cap 1,500 mg  1,500 mg Oral DAILY   ??? folic acid (FOLVITE) tablet 1 mg  1 mg Oral DAILY            Lab Review:     Recent Labs      05/29/17   0132  05/28/17   1154   WBC  9.6  9.4   HGB  8.2*  9.5*   HCT  24.3*  27.1*   PLT  354  402*     Recent Labs      05/29/17   0132  05/28/17   1154   NA  138  139   K  4.1  3.9   CL  106  103   CO2  23  23   GLU  94  103*   BUN  5*  4*   CREA  0.53*  0.61   CA  8.7  9.0   MG  1.7   --    PHOS  4.0   --    ALB   --   4.1   TBILI   --   4.0*   SGOT   --   33   ALT   --   30   INR   --   1.1     Lab Results   Component Value Date/Time    Glucose (POC) 104 (H) 05/28/2017 11:33 AM    Glucose (POC) 110 (H) 05/28/2017 11:22 AM     No results for input(s): PH, PCO2, PO2, HCO3, FIO2 in the last 72 hours.  Recent Labs      05/28/17   1154   INR  1.1     Lab Results   Component Value Date/Time    Specimen Description: CERVIX 11/23/2007 06:45 PM     Lab Results    Component Value Date/Time    Culture result: MIXED UROGENITAL FLORA ISOLATED 05/28/2017 01:02 PM            Assessment:     Principal Problem:    TIA (transient ischemic attack) (05/28/2017)    Active Problems:    History of stroke (05/28/2017)      Sickle cell disease (HCC) (05/28/2017)      Hyperglycemia (05/28/2017)           Plan:     Principal Problem:    TIA (transient ischemic attack) (05/28/2017)   - Neurology and Vascular input appreciated    - on Arixtra pending carotid evaluation   - unfortunately, Radiology will not give her contrast even with premedication due to the severity of her prior reaction   - await Vascular's decision on alternate imaging, perhaps MRA of neck, vs home on or off anticoagulation   - patient [facility] way    Active Problems:    History of stroke (05/28/2017)   - as above, continue  ASA   - was previously anticoagulated and then on regimen of exchange transfusion      Sickle cell disease (HCC) (05/28/2017)   - continue home regimen of oral pain meds, better control today      Hyperglycemia (05/28/2017)   - follow FBS PRN      Total time spent in patient care: 25 minutes                  Care Plan discussed with: Patient and Nursing Staff    Discussed:  Code Status, Care Plan and D/C Planning    Prophylaxis:  SCD's    Disposition:  Home w/Family           ___________________________________________________    Attending Physician: Marijo File, MD

## 2017-05-30 NOTE — Discharge Summary (Signed)
Physician Discharge Summary     Patient ID:  Colleen Harvey  [id number]  35 y.o.  [birth year]    Admit date: 05/28/2017    Discharge date: 05/30/2017    Admission Diagnoses: TIA (transient ischemic attack)    Discharge Diagnoses:  Principal Diagnosis TIA (transient ischemic attack)                                            Principal Problem:    TIA (transient ischemic attack) (05/28/2017)    Active Problems:    History of stroke (05/28/2017)      Sickle cell disease (HCC) (05/28/2017)      Hyperglycemia (05/28/2017)         Resolved Problems:  Problem List as of 05/30/2017  Date Reviewed: 05/28/2017          Codes Class Noted - Resolved    * (Principal)TIA (transient ischemic attack) ICD-10-CM: G45.9  ICD-9-CM: 435.9  05/28/2017 - Present        History of stroke ICD-10-CM: Z86.73  ICD-9-CM: V12.54  05/28/2017 - Present        Sickle cell disease (HCC) (Chronic) ICD-10-CM: D57.1  ICD-9-CM: 282.60  05/28/2017 - Present        Hyperglycemia ICD-10-CM: R73.9  ICD-9-CM: 790.29  05/28/2017 - Present                Hospital Course:   [name] was admitted to the Hospitalist Service on the 3rd floor for treatment of TIA.  Her right sided numbness and tingling resolved in the ED and did not recur.  She underwent MRI/MRA of the head which showed old left basal ganglia infarct but no new ischemia.  Carotid doppler showed completely occluded left ICA and 0-49% right carotid.  Echocardiogram was unremarkable and lipids looked good.  She was evaluated by Neurology and continued on ASA.  She was evaluated by Vascular Surgery because of the occluded left ICA.  There was concern that this might be related to a dissection and she was transiently placed on Arixtra for anticoagulation (due to her pork allergy).  A CTA of the neck could not be done due to her severe allergy to IV contrast.  Vascular offered her inpatient MRA however she preferred to be discharged on oral anticoagulation and will follow up with her PCP later this week.     She was discharged home on 05/30/2017 in improved condition.        PCP: Charlene BrookeWally R Smith, MD    Consults: Nephrology and Vascular Surgery    Discharge Exam:  See my Progress Note from today.    Disposition: home    Patient Instructions:   Current Discharge Medication List      START taking these medications    Details   aspirin 81 mg chewable tablet Take 1 Tab by mouth daily.      apixaban (ELIQUIS) 5 mg tablet Take 1 Tab by mouth two (2) times a day.  Qty: 60 Tab, Refills: 0         CONTINUE these medications which have NOT CHANGED    Details   hydroxyurea (HYDREA) 500 mg capsule Take 1,500 mg by mouth daily. Unsure of dose       alprazolam (XANAX PO) Take  by mouth as needed. Possibly 1 mg, unsure      HYDROmorphone (DILAUDID) 4 mg tablet Take  16 mg by mouth every four (4) hours as needed for Pain.      zolpidem (AMBIEN) 5 mg tablet Take  by mouth nightly as needed for Sleep.            Activity: Activity as tolerated  Diet: Cardiac Diet  Wound Care: None needed    Follow-up Information     Follow up With Details Comments Contact Info    Charlene BrookeWally R Smith, MD  as scheduled [address]  4th Floor  [place] [id number]  [phone]      Pauline AusAndrew E Leake, MD  As needed [address]  [place] [id number]  [phone]            35 minutes were spent on this discharge.    Signed:  Marijo Filelay Aziel Morgan, MD  05/30/2017  12:18 PM

## 2017-05-30 NOTE — Discharge Summary (Signed)
Discharge Summary by Marijo FileBeveridge, Bryley Kovacevic, MD at 05/30/17 1218                Author: Marijo FileBeveridge, Kacee Koren, MD  Service: Hospitalist  Author Type: Physician       Filed: 05/30/17 1234  Date of Service: 05/30/17 1218  Status: Signed          Editor: Marijo FileBeveridge, Shakiera Edelson, MD (Physician)                         Physician Discharge Summary        Patient ID:   Colleen Harvey   [id number]   36 y.o.   08/06/1981      Admit date: 05/28/2017      Discharge date: 05/30/2017      Admission Diagnoses: TIA (transient ischemic attack)      Discharge Diagnoses:  Principal Diagnosis TIA (transient ischemic  attack)                                             Principal Problem:     TIA (transient ischemic attack) (05/28/2017)      Active Problems:     History of stroke (05/28/2017)        Sickle cell disease (HCC) (05/28/2017)        Hyperglycemia (05/28/2017)             Resolved Problems:      Problem List as of 05/30/2017   Date Reviewed:  05/28/2017                Codes  Class  Noted - Resolved             * (Principal)TIA (transient ischemic attack)  ICD-10-CM: G45.9   ICD-9-CM: 435.9    05/28/2017 - Present                       History of stroke  ICD-10-CM: Z86.73   ICD-9-CM: V12.54    05/28/2017 - Present                       Sickle cell disease (HCC) (Chronic)  ICD-10-CM: D57.1   ICD-9-CM: 282.60    05/28/2017 - Present                       Hyperglycemia  ICD-10-CM: R73.9   ICD-9-CM: 790.29    05/28/2017 - Present                             Hospital Course:    Colleen Harvey was admitted to the Hospitalist  Service on the 3rd floor for treatment of TIA.  Her right sided numbness and tingling resolved in the ED and did not recur.  She underwent MRI/MRA of the head which showed old left basal ganglia infarct but no new ischemia.  Carotid doppler showed completely  occluded left ICA and 0-49% right carotid.  Echocardiogram was unremarkable and lipids looked good.  She was evaluated by Neurology and continued on ASA.  She was evaluated by Vascular  Surgery because of the occluded left ICA.  There was concern that  this might be related to a dissection and she was transiently placed on Arixtra for anticoagulation (due to her pork  allergy).  A CTA of the neck could not be done due to her severe allergy to IV contrast.  Vascular offered her inpatient MRA however she  preferred to be discharged on oral anticoagulation and will follow up with her PCP later this week.      She was discharged home on 05/30/2017  in improved condition.           PCP: Charlene BrookeWally R Smith, MD      Consults: Nephrology and Vascular Surgery      Discharge Exam:   See my Progress Note from today.      Disposition: home      Patient Instructions:      Current Discharge Medication List              START taking these medications          Details        aspirin 81 mg chewable tablet  Take 1 Tab by mouth daily.               apixaban (ELIQUIS) 5 mg tablet  Take 1 Tab by mouth two (2) times a day.   Qty: 60 Tab, Refills:  0                     CONTINUE these medications which have NOT CHANGED          Details        hydroxyurea (HYDREA) 500 mg capsule  Take 1,500 mg by mouth daily. Unsure of dose                alprazolam (XANAX PO)  Take  by mouth as needed. Possibly 1 mg, unsure               HYDROmorphone (DILAUDID) 4 mg tablet  Take 16 mg by mouth every four (4) hours as needed for Pain.               zolpidem (AMBIEN) 5 mg tablet  Take  by mouth nightly as needed for Sleep.                       Activity: Activity as tolerated   Diet: Cardiac Diet   Wound Care: None needed        Follow-up Information        Follow up With  Details  Comments  Contact Info             Charlene BrookeWally R Smith, MD    as scheduled  [address]   [address]   [place] [id number]   [phone]                Pauline AusAndrew E Leake, MD    As needed  [address]   [place] [id number]   [phone]                   35 minutes were spent on this discharge.      Signed:   Marijo Filelay Nialah Saravia, MD   05/30/2017   12:18 PM

## 2017-05-31 NOTE — Procedures (Signed)
[facility]  *** FINAL REPORT ***    Name: Colleen Harvey, Colleen Harvey  MRN: [id number]    Inpatient  DOB: 12 Jun 1981  HIS Order #: [id number]  TRAKnet Visit #: [id number]  Date: 28 May 2017    TYPE OF TEST: Cerebrovascular Duplex    REASON FOR TEST  Transient ischemic attacks    Right Carotid:-             Proximal               Mid                 Distal  cm/s  Systolic  Diastolic  Systolic  Diastolic  Systolic  Diastolic  CCA:    160.1      27.6                           125.2      32.3  Bulb:  ECA:     91.9      11.1  ICA:    146.1      36.9       96.8      43.5       98.3      41.8  ICA/CCA:  1.2       1.1    ICA Stenosis: <50%    Right Vertebral:-  Finding: Antegrade  Sys:       80.6  Dia:       35.4    Right Subclavian:    Left Carotid:-            Proximal                Mid                 Distal  cm/s  Systolic  Diastolic  Systolic  Diastolic  Systolic  Diastolic  CCA:    164.7       0.0                           118.2       0.0  Bulb:  ECA:     75.8      12.8  ICA:      0.0       0.0        0.0       0.0        0.0       0.0  ICA/CCA:  0.0       0.0    ICA Stenosis: Occluded    Left Vertebral:-  Finding: Antegrade  Sys:       79.9  Dia:       29.9    Left Subclavian:    INTERPRETATION/FINDINGS  PROCEDURE:  Evaluation of the extracranial cerebrovascular arteries  with ultrasound (B-mode imaging, pulsed Doppler, color Doppler).  Includes the common carotid, internal carotid, external carotid, and  vertebral arteries.    FINDINGS: Gray-scale and color flow duplex images of the extracranial,   carotid, and vertebral arteries were performed bilaterally. Gray  scale analysis shows an occluded internal carotid artery on the left  with mild plaquing in the 0% to 49% range on the right. Color flow  analysis shows decreased color flow in the areas of plaquing on the  left. Pulsed wave Doppler shows no focal velocity shifts.  Patent  vertebral arteries with normal antegrade bilaterally.    CONCLUSION:  1.Occluded  left internal carotid artery without certainty of whether  it is acute or chronic.  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  3.Patent vertebral arteries with antegrade flow bilaterally.    ADDITIONAL COMMENTS    I have personally reviewed the data relevant to the interpretation of  this  study.    TECHNOLOGIST: Rosalio MacadamiaYongdeok Bruce Shin  Signed: 05/28/2017 09:47 PM    PHYSICIAN: Vernia BuffMarc T. Sheliah HatchWarner, MD  Signed: 05/31/2017 07:19 AM

## 2019-06-05 ENCOUNTER — Encounter (HOSPITAL_COMMUNITY): Payer: Self-pay | Admitting: Emergency Medicine

## 2019-06-05 ENCOUNTER — Emergency Department (HOSPITAL_COMMUNITY): Payer: Medicare Other

## 2019-06-05 ENCOUNTER — Other Ambulatory Visit: Payer: Self-pay

## 2019-06-05 ENCOUNTER — Inpatient Hospital Stay (HOSPITAL_COMMUNITY)
Admission: EM | Admit: 2019-06-05 | Discharge: 2019-06-11 | DRG: 811 | Disposition: A | Discharge status: Home / Self Care | Payer: Medicare Other | Attending: Internal Medicine | Admitting: Internal Medicine

## 2019-06-05 DIAGNOSIS — W010XXA Fall on same level from slipping, tripping and stumbling without subsequent striking against object, initial encounter: Secondary | ICD-10-CM | POA: Diagnosis present

## 2019-06-05 DIAGNOSIS — I249 Acute ischemic heart disease, unspecified: Secondary | ICD-10-CM | POA: Diagnosis present

## 2019-06-05 DIAGNOSIS — J189 Pneumonia, unspecified organism: Secondary | ICD-10-CM | POA: Diagnosis present

## 2019-06-05 DIAGNOSIS — Z79899 Other long term (current) drug therapy: Secondary | ICD-10-CM

## 2019-06-05 DIAGNOSIS — Z8673 Personal history of transient ischemic attack (TIA), and cerebral infarction without residual deficits: Secondary | ICD-10-CM

## 2019-06-05 DIAGNOSIS — Z91018 Allergy to other foods: Secondary | ICD-10-CM

## 2019-06-05 DIAGNOSIS — G894 Chronic pain syndrome: Secondary | ICD-10-CM | POA: Diagnosis present

## 2019-06-05 DIAGNOSIS — R0989 Other specified symptoms and signs involving the circulatory and respiratory systems: Secondary | ICD-10-CM

## 2019-06-05 DIAGNOSIS — D5701 Hb-SS disease with acute chest syndrome: Secondary | ICD-10-CM | POA: Diagnosis present

## 2019-06-05 DIAGNOSIS — Z888 Allergy status to other drugs, medicaments and biological substances status: Secondary | ICD-10-CM

## 2019-06-05 DIAGNOSIS — D638 Anemia in other chronic diseases classified elsewhere: Secondary | ICD-10-CM | POA: Diagnosis present

## 2019-06-05 DIAGNOSIS — I248 Other forms of acute ischemic heart disease: Secondary | ICD-10-CM | POA: Diagnosis present

## 2019-06-05 DIAGNOSIS — Z8249 Family history of ischemic heart disease and other diseases of the circulatory system: Secondary | ICD-10-CM

## 2019-06-05 DIAGNOSIS — Z20828 Contact with and (suspected) exposure to other viral communicable diseases: Secondary | ICD-10-CM | POA: Diagnosis present

## 2019-06-05 DIAGNOSIS — T508X5A Adverse effect of diagnostic agents, initial encounter: Secondary | ICD-10-CM | POA: Diagnosis present

## 2019-06-05 DIAGNOSIS — D5700 Hb-SS disease with crisis, unspecified: Principal | ICD-10-CM | POA: Diagnosis present

## 2019-06-05 DIAGNOSIS — Z79891 Long term (current) use of opiate analgesic: Secondary | ICD-10-CM

## 2019-06-05 DIAGNOSIS — I6522 Occlusion and stenosis of left carotid artery: Secondary | ICD-10-CM | POA: Diagnosis present

## 2019-06-05 DIAGNOSIS — F410 Panic disorder [episodic paroxysmal anxiety] without agoraphobia: Secondary | ICD-10-CM | POA: Diagnosis not present

## 2019-06-05 DIAGNOSIS — R918 Other nonspecific abnormal finding of lung field: Secondary | ICD-10-CM

## 2019-06-05 DIAGNOSIS — I779 Disorder of arteries and arterioles, unspecified: Secondary | ICD-10-CM | POA: Diagnosis present

## 2019-06-05 DIAGNOSIS — Z91041 Radiographic dye allergy status: Secondary | ICD-10-CM

## 2019-06-05 DIAGNOSIS — R739 Hyperglycemia, unspecified: Secondary | ICD-10-CM | POA: Diagnosis present

## 2019-06-05 HISTORY — DX: Sickle-cell disease without crisis: D57.1

## 2019-06-05 HISTORY — DX: Cerebral infarction, unspecified: I63.9

## 2019-06-05 LAB — CBC WITH DIFFERENTIAL/PLATELET
Abs Immature Granulocytes: 0.1 10*3/uL — ABNORMAL HIGH (ref 0.00–0.07)
Basophils Absolute: 0 10*3/uL (ref 0.0–0.1)
Basophils Relative: 0 %
Eosinophils Absolute: 0 10*3/uL (ref 0.0–0.5)
Eosinophils Relative: 0 %
HCT: 28.5 % — ABNORMAL LOW (ref 36.0–46.0)
Hemoglobin: 10.5 g/dL — ABNORMAL LOW (ref 12.0–15.0)
Immature Granulocytes: 1 %
Lymphocytes Relative: 5 %
Lymphs Abs: 0.5 10*3/uL — ABNORMAL LOW (ref 0.7–4.0)
MCH: 37.5 pg — ABNORMAL HIGH (ref 26.0–34.0)
MCHC: 36.8 g/dL — ABNORMAL HIGH (ref 30.0–36.0)
MCV: 101.8 fL — ABNORMAL HIGH (ref 80.0–100.0)
Monocytes Absolute: 1 10*3/uL (ref 0.1–1.0)
Monocytes Relative: 7 %
Neutro Abs: 12 10*3/uL — ABNORMAL HIGH (ref 1.7–7.7)
Neutrophils Relative %: 87 %
Platelets: 310 10*3/uL (ref 150–400)
RBC: 2.8 MIL/uL — ABNORMAL LOW (ref 3.87–5.11)
RDW: 19.2 % — ABNORMAL HIGH (ref 11.5–15.5)
WBC: 13.5 10*3/uL — ABNORMAL HIGH (ref 4.0–10.5)
nRBC: 6.4 % — ABNORMAL HIGH (ref 0.0–0.2)

## 2019-06-05 LAB — COMPREHENSIVE METABOLIC PANEL
ALT: 58 U/L — ABNORMAL HIGH (ref 0–44)
AST: 50 U/L — ABNORMAL HIGH (ref 15–41)
Albumin: 4 g/dL (ref 3.5–5.0)
Alkaline Phosphatase: 73 U/L (ref 38–126)
Anion gap: 11 (ref 5–15)
BUN: 6 mg/dL (ref 6–20)
CO2: 22 mmol/L (ref 22–32)
Calcium: 8.8 mg/dL — ABNORMAL LOW (ref 8.9–10.3)
Chloride: 100 mmol/L (ref 98–111)
Creatinine, Ser: 0.73 mg/dL (ref 0.44–1.00)
GFR calc Af Amer: >60 mL/min (ref >60)
GFR calc non Af Amer: >60 mL/min (ref >60)
Glucose, Bld: 181 mg/dL — ABNORMAL HIGH (ref 70–99)
Potassium: 4.4 mmol/L (ref 3.5–5.1)
Sodium: 133 mmol/L — ABNORMAL LOW (ref 135–145)
Total Bilirubin: 2 mg/dL — ABNORMAL HIGH (ref 0.3–1.2)
Total Protein: 8 g/dL (ref 6.5–8.1)

## 2019-06-05 LAB — TYPE AND SCREEN
ABO/RH(D): A POS
Antibody Screen: NEGATIVE

## 2019-06-05 LAB — I-STAT BETA HCG BLOOD, ED (MC, WL, AP ONLY): I-stat hCG, quantitative: <5 m[IU]/mL (ref <5)

## 2019-06-05 LAB — PROTIME-INR
INR: 1.2 (ref 0.8–1.2)
Prothrombin Time: 14.7 seconds (ref 11.4–15.2)

## 2019-06-05 LAB — APTT: aPTT: 31 seconds (ref 24–36)

## 2019-06-05 LAB — RETICULOCYTES
Immature Retic Fract: 22.2 % — ABNORMAL HIGH (ref 2.3–15.9)
RBC.: 2.8 MIL/uL — ABNORMAL LOW (ref 3.87–5.11)
Retic Count, Absolute: 214 10*3/uL — ABNORMAL HIGH (ref 19.0–186.0)
Retic Ct Pct: 7.7 % — ABNORMAL HIGH (ref 0.4–3.1)

## 2019-06-05 MED ORDER — HYDROMORPHONE HCL 2 MG/ML IJ SOLN
2.0000 mg | INTRAMUSCULAR | Status: AC
  Administered 2019-06-06 (×2): 2 mg via INTRAVENOUS
  Filled 2019-06-05 (×2): qty 1

## 2019-06-05 MED ORDER — DIPHENHYDRAMINE HCL 50 MG/ML IJ SOLN
25.0000 mg | Freq: Once | INTRAMUSCULAR | Status: AC
  Administered 2019-06-05: 23:00:00 25 mg via INTRAVENOUS
  Filled 2019-06-05: qty 1

## 2019-06-05 MED ORDER — HYDROMORPHONE HCL 2 MG/ML IJ SOLN: 2.0000 mg | INTRAMUSCULAR | Status: AC

## 2019-06-05 MED ORDER — SODIUM CHLORIDE (PF) 0.9 % IJ SOLN
INTRAMUSCULAR | Status: AC
  Filled 2019-06-05: qty 50

## 2019-06-05 MED ORDER — HYDROMORPHONE HCL 2 MG/ML IJ SOLN
2.0000 mg | INTRAMUSCULAR | Status: AC
  Administered 2019-06-05: 23:00:00 2 mg via INTRAVENOUS
  Filled 2019-06-05: qty 1

## 2019-06-05 MED ORDER — SODIUM CHLORIDE 0.9% FLUSH
3.0000 mL | Freq: Once | INTRAVENOUS | Status: AC
  Administered 2019-06-05: 3 mL via INTRAVENOUS

## 2019-06-05 MED ORDER — HYDROMORPHONE HCL 2 MG/ML IJ SOLN
2.0000 mg | INTRAMUSCULAR | Status: AC
  Administered 2019-06-05: 2 mg via INTRAVENOUS
  Filled 2019-06-05: qty 1

## 2019-06-05 MED ORDER — IOHEXOL 350 MG/ML SOLN
100.0000 mL | Freq: Once | INTRAVENOUS | Status: AC | PRN
  Administered 2019-06-06: 100 mL via INTRAVENOUS

## 2019-06-05 MED ORDER — DEXTROSE-NACL 5-0.45 % IV SOLN
INTRAVENOUS | Status: DC
  Administered 2019-06-05 – 2019-06-06 (×2): via INTRAVENOUS

## 2019-06-05 MED ORDER — HYDROMORPHONE HCL 2 MG/ML IJ SOLN
2.0000 mg | INTRAMUSCULAR | Status: AC
  Administered 2019-06-06: 2 mg via INTRAVENOUS
  Filled 2019-06-05: qty 1

## 2019-06-05 NOTE — ED Triage Notes (Signed)
Patient here from home with complaints of sickle cell pain crisis. Pain all over. Reports taking Dilaudid 3 hours ago with no relief.
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
## 2019-06-06 ENCOUNTER — Encounter (HOSPITAL_COMMUNITY): Payer: Self-pay | Admitting: Internal Medicine

## 2019-06-06 DIAGNOSIS — F410 Panic disorder [episodic paroxysmal anxiety] without agoraphobia: Secondary | ICD-10-CM | POA: Diagnosis not present

## 2019-06-06 DIAGNOSIS — D638 Anemia in other chronic diseases classified elsewhere: Secondary | ICD-10-CM | POA: Diagnosis present

## 2019-06-06 DIAGNOSIS — D5700 Hb-SS disease with crisis, unspecified: Secondary | ICD-10-CM | POA: Diagnosis present

## 2019-06-06 DIAGNOSIS — T508X5D Adverse effect of diagnostic agents, subsequent encounter: Secondary | ICD-10-CM | POA: Diagnosis not present

## 2019-06-06 DIAGNOSIS — Z8249 Family history of ischemic heart disease and other diseases of the circulatory system: Secondary | ICD-10-CM | POA: Diagnosis not present

## 2019-06-06 DIAGNOSIS — T508X5A Adverse effect of diagnostic agents, initial encounter: Secondary | ICD-10-CM | POA: Diagnosis present

## 2019-06-06 DIAGNOSIS — G894 Chronic pain syndrome: Secondary | ICD-10-CM | POA: Diagnosis present

## 2019-06-06 DIAGNOSIS — J189 Pneumonia, unspecified organism: Secondary | ICD-10-CM | POA: Diagnosis present

## 2019-06-06 DIAGNOSIS — I6522 Occlusion and stenosis of left carotid artery: Secondary | ICD-10-CM | POA: Diagnosis present

## 2019-06-06 DIAGNOSIS — Z79899 Other long term (current) drug therapy: Secondary | ICD-10-CM | POA: Diagnosis not present

## 2019-06-06 DIAGNOSIS — W010XXA Fall on same level from slipping, tripping and stumbling without subsequent striking against object, initial encounter: Secondary | ICD-10-CM | POA: Diagnosis present

## 2019-06-06 DIAGNOSIS — Z91018 Allergy to other foods: Secondary | ICD-10-CM | POA: Diagnosis not present

## 2019-06-06 DIAGNOSIS — R079 Chest pain, unspecified: Secondary | ICD-10-CM | POA: Diagnosis not present

## 2019-06-06 DIAGNOSIS — R739 Hyperglycemia, unspecified: Secondary | ICD-10-CM | POA: Diagnosis present

## 2019-06-06 DIAGNOSIS — I248 Other forms of acute ischemic heart disease: Secondary | ICD-10-CM | POA: Diagnosis present

## 2019-06-06 DIAGNOSIS — Z888 Allergy status to other drugs, medicaments and biological substances status: Secondary | ICD-10-CM | POA: Diagnosis not present

## 2019-06-06 DIAGNOSIS — Z20828 Contact with and (suspected) exposure to other viral communicable diseases: Secondary | ICD-10-CM | POA: Diagnosis present

## 2019-06-06 DIAGNOSIS — Z91041 Radiographic dye allergy status: Secondary | ICD-10-CM | POA: Diagnosis not present

## 2019-06-06 DIAGNOSIS — Z8673 Personal history of transient ischemic attack (TIA), and cerebral infarction without residual deficits: Secondary | ICD-10-CM | POA: Diagnosis not present

## 2019-06-06 DIAGNOSIS — Z79891 Long term (current) use of opiate analgesic: Secondary | ICD-10-CM | POA: Diagnosis not present

## 2019-06-06 DIAGNOSIS — D5701 Hb-SS disease with acute chest syndrome: Secondary | ICD-10-CM | POA: Diagnosis not present

## 2019-06-06 DIAGNOSIS — I249 Acute ischemic heart disease, unspecified: Secondary | ICD-10-CM | POA: Diagnosis not present

## 2019-06-06 LAB — ABO/RH: ABO/RH(D): A POS

## 2019-06-06 LAB — CBC WITH DIFFERENTIAL/PLATELET
Abs Immature Granulocytes: 0.09 10*3/uL — ABNORMAL HIGH (ref 0.00–0.07)
Basophils Absolute: 0 10*3/uL (ref 0.0–0.1)
Basophils Relative: 0 %
Eosinophils Absolute: 0 10*3/uL (ref 0.0–0.5)
Eosinophils Relative: 0 %
HCT: 27.9 % — ABNORMAL LOW (ref 36.0–46.0)
Hemoglobin: 9.9 g/dL — ABNORMAL LOW (ref 12.0–15.0)
Immature Granulocytes: 1 %
Lymphocytes Relative: 4 %
Lymphs Abs: 0.7 10*3/uL (ref 0.7–4.0)
MCH: 37.1 pg — ABNORMAL HIGH (ref 26.0–34.0)
MCHC: 35.5 g/dL (ref 30.0–36.0)
MCV: 104.5 fL — ABNORMAL HIGH (ref 80.0–100.0)
Monocytes Absolute: 2 10*3/uL — ABNORMAL HIGH (ref 0.1–1.0)
Monocytes Relative: 13 %
Neutro Abs: 12.7 10*3/uL — ABNORMAL HIGH (ref 1.7–7.7)
Neutrophils Relative %: 82 %
Platelets: 277 10*3/uL (ref 150–400)
RBC: 2.67 MIL/uL — ABNORMAL LOW (ref 3.87–5.11)
RDW: 19.8 % — ABNORMAL HIGH (ref 11.5–15.5)
WBC: 15.5 10*3/uL — ABNORMAL HIGH (ref 4.0–10.5)
nRBC: 5.5 % — ABNORMAL HIGH (ref 0.0–0.2)

## 2019-06-06 LAB — SARS CORONAVIRUS 2 (TAT 6-24 HRS): SARS Coronavirus 2: NEGATIVE

## 2019-06-06 LAB — URINALYSIS, ROUTINE W REFLEX MICROSCOPIC
Bilirubin Urine: NEGATIVE
Glucose, UA: NEGATIVE mg/dL
Ketones, ur: NEGATIVE mg/dL
Leukocytes,Ua: NEGATIVE
Nitrite: NEGATIVE
Protein, ur: 30 mg/dL — AB
Specific Gravity, Urine: 1.029 (ref 1.005–1.030)
pH: 7 (ref 5.0–8.0)

## 2019-06-06 LAB — LACTIC ACID, PLASMA: Lactic Acid, Venous: 1.6 mmol/L (ref 0.5–1.9)

## 2019-06-06 LAB — PROCALCITONIN: Procalcitonin: 0.12 ng/mL

## 2019-06-06 LAB — TROPONIN I (HIGH SENSITIVITY)
Troponin I (High Sensitivity): 603 ng/L (ref <18)
Troponin I (High Sensitivity): 871 ng/L (ref <18)
Troponin I (High Sensitivity): 1065 ng/L (ref <18)
Troponin I (High Sensitivity): 1157 ng/L

## 2019-06-06 MED ORDER — SENNOSIDES-DOCUSATE SODIUM 8.6-50 MG PO TABS
1.0000 | ORAL_TABLET | Freq: Two times a day (BID) | ORAL | Status: DC
  Administered 2019-06-06 – 2019-06-10 (×2): 1 via ORAL
  Filled 2019-06-06 (×9): qty 1

## 2019-06-06 MED ORDER — POLYETHYLENE GLYCOL 3350 17 G PO PACK: 17.0000 g | PACK | Freq: Every day | ORAL | Status: DC | PRN

## 2019-06-06 MED ORDER — VANCOMYCIN HCL IN DEXTROSE 1-5 GM/200ML-% IV SOLN
1000.0000 mg | Freq: Once | INTRAVENOUS | Status: AC
  Administered 2019-06-06: 21:00:00 1000 mg via INTRAVENOUS
  Filled 2019-06-06: qty 200

## 2019-06-06 MED ORDER — ASPIRIN EC 81 MG PO TBEC
81.0000 mg | DELAYED_RELEASE_TABLET | Freq: Every day | ORAL | Status: DC
  Administered 2019-06-06 – 2019-06-10 (×5): 81 mg via ORAL
  Filled 2019-06-06 (×6): qty 1

## 2019-06-06 MED ORDER — SODIUM CHLORIDE 0.45 % IV SOLN
INTRAVENOUS | Status: AC
  Administered 2019-06-06: 17:00:00 via INTRAVENOUS

## 2019-06-06 MED ORDER — DOCUSATE SODIUM 100 MG PO CAPS: 100.0000 mg | ORAL_CAPSULE | Freq: Every day | ORAL | Status: DC | PRN

## 2019-06-06 MED ORDER — PANTOPRAZOLE SODIUM 40 MG PO TBEC
40.0000 mg | DELAYED_RELEASE_TABLET | Freq: Every day | ORAL | Status: DC
  Administered 2019-06-06 – 2019-06-10 (×5): 40 mg via ORAL
  Filled 2019-06-06 (×6): qty 1

## 2019-06-06 MED ORDER — METOPROLOL TARTRATE 12.5 MG HALF TABLET
12.5000 mg | ORAL_TABLET | Freq: Two times a day (BID) | ORAL | Status: DC
  Administered 2019-06-06 – 2019-06-10 (×6): 12.5 mg via ORAL
  Filled 2019-06-06 (×9): qty 1

## 2019-06-06 MED ORDER — KETOROLAC TROMETHAMINE 15 MG/ML IJ SOLN
15.0000 mg | Freq: Four times a day (QID) | INTRAMUSCULAR | Status: DC
  Administered 2019-06-06: 17:00:00 15 mg via INTRAVENOUS
  Filled 2019-06-06: qty 1

## 2019-06-06 MED ORDER — DICLOFENAC SODIUM 1 % TD GEL
2.0000 g | Freq: Four times a day (QID) | TRANSDERMAL | Status: DC
  Administered 2019-06-06 – 2019-06-11 (×19): 2 g via TOPICAL
  Filled 2019-06-06: qty 100

## 2019-06-06 MED ORDER — ALPRAZOLAM 0.5 MG PO TABS: 0.5000 mg | ORAL_TABLET | Freq: Every day | ORAL | Status: DC | PRN

## 2019-06-06 MED ORDER — FONDAPARINUX SODIUM 2.5 MG/0.5ML ~~LOC~~ SOLN
2.5000 mg | Freq: Every day | SUBCUTANEOUS | Status: DC
  Administered 2019-06-06 – 2019-06-11 (×5): 2.5 mg via SUBCUTANEOUS
  Filled 2019-06-06 (×5): qty 0.5

## 2019-06-06 MED ORDER — VANCOMYCIN HCL IN DEXTROSE 750-5 MG/150ML-% IV SOLN
750.0000 mg | Freq: Two times a day (BID) | INTRAVENOUS | Status: DC
  Administered 2019-06-07 – 2019-06-09 (×5): 750 mg via INTRAVENOUS
  Filled 2019-06-06 (×6): qty 150

## 2019-06-06 MED ORDER — HYDROMORPHONE 1 MG/ML IV SOLN
INTRAVENOUS | Status: DC
  Administered 2019-06-06: 30 mg via INTRAVENOUS
  Filled 2019-06-06: qty 30

## 2019-06-06 MED ORDER — NALOXONE HCL 0.4 MG/ML IJ SOLN: 0.4000 mg | INTRAMUSCULAR | Status: DC | PRN

## 2019-06-06 MED ORDER — HYDROMORPHONE 1 MG/ML IV SOLN
INTRAVENOUS | Status: DC
  Administered 2019-06-06: 3.66 mg via INTRAVENOUS
  Administered 2019-06-06: 3 mg via INTRAVENOUS
  Administered 2019-06-07: 1.5 mg via INTRAVENOUS
  Administered 2019-06-07 (×2): 4.5 mg via INTRAVENOUS
  Administered 2019-06-07: 1.5 mg via INTRAVENOUS
  Administered 2019-06-08: 2.5 mg via INTRAVENOUS
  Administered 2019-06-08: 3 mg via INTRAVENOUS
  Administered 2019-06-08: 5.5 mg via INTRAVENOUS
  Administered 2019-06-08 – 2019-06-09 (×2): 30 mg via INTRAVENOUS
  Administered 2019-06-09: 5.5 mg via INTRAVENOUS
  Administered 2019-06-10: 2 mg via INTRAVENOUS
  Administered 2019-06-10: 3.5 mg via INTRAVENOUS
  Administered 2019-06-10: 3 mg via INTRAVENOUS
  Administered 2019-06-10: 2.5 mg via INTRAVENOUS
  Filled 2019-06-06 (×2): qty 30

## 2019-06-06 MED ORDER — ONDANSETRON HCL 4 MG/2ML IJ SOLN: 4.0000 mg | Freq: Four times a day (QID) | INTRAMUSCULAR | Status: DC | PRN

## 2019-06-06 MED ORDER — L-GLUTAMINE ORAL POWDER
10.0000 g | PACK | Freq: Two times a day (BID) | ORAL | Status: DC
  Administered 2019-06-06 – 2019-06-10 (×9): 10 g via ORAL
  Filled 2019-06-06 (×11): qty 2

## 2019-06-06 MED ORDER — HYDROXYUREA 500 MG PO CAPS
1500.0000 mg | ORAL_CAPSULE | Freq: Every day | ORAL | Status: DC
  Administered 2019-06-06 – 2019-06-10 (×5): 1500 mg via ORAL
  Filled 2019-06-06 (×6): qty 3

## 2019-06-06 MED ORDER — SODIUM CHLORIDE 0.9 % IV SOLN
2.0000 g | Freq: Three times a day (TID) | INTRAVENOUS | Status: DC
  Administered 2019-06-06 – 2019-06-11 (×14): 2 g via INTRAVENOUS
  Filled 2019-06-06 (×16): qty 2

## 2019-06-06 MED ORDER — HYDROMORPHONE HCL 2 MG/ML IJ SOLN
2.0000 mg | INTRAMUSCULAR | Status: DC | PRN
  Administered 2019-06-06 (×4): 2 mg via INTRAVENOUS
  Filled 2019-06-06 (×4): qty 1

## 2019-06-06 MED ORDER — VOXELOTOR 500 MG PO TABS
1500.0000 mg | ORAL_TABLET | Freq: Every day | ORAL | Status: DC
  Administered 2019-06-07: 1500 mg via ORAL

## 2019-06-06 MED ORDER — ACETAMINOPHEN 325 MG PO TABS
650.0000 mg | ORAL_TABLET | Freq: Four times a day (QID) | ORAL | Status: DC | PRN
  Administered 2019-06-06: 20:00:00 650 mg via ORAL
  Filled 2019-06-06: qty 2

## 2019-06-06 MED ORDER — SODIUM CHLORIDE 0.9% FLUSH: 9.0000 mL | INTRAVENOUS | Status: DC | PRN

## 2019-06-06 MED ORDER — ATORVASTATIN CALCIUM 40 MG PO TABS
80.0000 mg | ORAL_TABLET | Freq: Every day | ORAL | Status: DC
  Administered 2019-06-06 – 2019-06-10 (×5): 80 mg via ORAL
  Filled 2019-06-06 (×5): qty 2

## 2019-06-06 NOTE — ED Notes (Signed)
Pt in CT scan at this time
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
## 2019-06-06 NOTE — Progress Notes (Signed)
Pharmacy Antibiotic Note  Adrienne Miller is a 38 y.o. female admitted on 06/05/2019 with sepsis. Pharmacy has been consulted for vancomycin and cefepime dosing.  Plan: Vancomycin 1gm IV x 1 then 750mg  q12h (AUC 477.8, Scr 0.73) Cefepime 2gm IV q8h Daily Scr while on ketorolac Follow renal function, cultures and clinical course  Height: 5\' 4"  (162.6 cm) Weight: 118 lb (53.5 kg) IBW/kg (Calculated) : 54.7  Temp (24hrs), Avg:99.9 F (37.7 C), Min:98.6 F (37 C), Max:102.7 F (39.3 C)  Recent Labs  Lab 06/05/19 2024 06/06/19 1656  WBC 13.5* 15.5*  CREATININE 0.73  --     Estimated Creatinine Clearance: 82.9 mL/min (by C-G formula based on SCr of 0.73 mg/dL).    Allergies  Allergen Reactions  . Gadobenate Anaphylaxis  . Iodine Anaphylaxis    Iv dye  . Pork-Derived Products     Other reaction(s): Other (comments) Causes generalized pain    Antimicrobials this admission: 8/4 vanc >> 8/4 cefepime >> Dose adjustments this admission:   Microbiology results: 8/4 BCx:   Thank you for allowing pharmacy to be a part of this patient's care.  Dolly Rias RPh 06/06/2019, 8:46 PM Pager [phone]
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
## 2019-06-06 NOTE — Progress Notes (Signed)
Patient was transferred to [place] at 1640. RN got report at 46. Alert and oriented x 4. Pain is complained at 7/10. Vital signs was taken. Room is set up. Call light is within patient's reach. PCA is set up.

## 2019-06-06 NOTE — Progress Notes (Addendum)
Patient discussed with primary team. History of sickle cell admitted with chest pain being managed for sickle cell pain crisis. Elevated trop on initial check 600, EKG sinus tach nonspecific ST/T changes. Overall would be considered nonspecific findings in setting of sickle cell critis. Symptoms atypical for cardiac ischemia, mainly right sided rib pain and bilateral hip pain similar to prior sickle cell flares per ER note. CT PE is negative, no coronary or aortic calcifications noted. Recommended ASA, cycle enzymes and EKGs, echo and we will add to rounding list in AM. Lower suspicion for ACS, most likely demand ischemia in setting of sickel cell crisis,  if significant trop elevation could start anticoag, would need to consult with pharmacy about best option given her pork product allergy and potential for reaction to heparin.   Zandra Abts MD

## 2019-06-06 NOTE — Progress Notes (Signed)
CRITICAL VALUE ALERT  Critical Value:  Troponin= 603  Date & Time Notied:  06/06/2019 at [place]  Provider Notified: MD on call  Orders Received/Actions taken: waiting for order.

## 2019-06-06 NOTE — ED Provider Notes (Signed)
Care assumed from Dr. Vallery Ridge at shift change.  Patient is awaiting results of a CT scan of her chest to rule out pulmonary embolism and to further evaluate her abnormal chest x-ray.  Patient with history of sickle cell disease with findings on plain film consistent with acute chest.  Patient is mildly hypoxic upon presentation.  Laboratory studies are essentially unremarkable.  Patient given IV pain medication without much improvement.  Care discussed with Dr. Hal Hope who agrees to admit.   Veryl Speak, MD 06/06/19 [phone]

## 2019-06-06 NOTE — Progress Notes (Signed)
ANTICOAGULATION CONSULT NOTE - Initial Consult  Pharmacy Consult for Arixtra Indication: elevated troponins  Allergies  Allergen Reactions  . Gadobenate Anaphylaxis  . Iodine Anaphylaxis    Iv dye  . Pork-Derived Products     Other reaction(s): Other (comments) Causes generalized pain    Patient Measurements: Height: 5\' 4"  (162.6 cm) Weight: 118 lb (53.5 kg) IBW/kg (Calculated) : 54.7   Vital Signs: Temp: 99.7 F (37.6 C) (08/04 2200) Temp Source: Oral (08/04 1650) BP: 87/60 (08/04 2200) Pulse Rate: 101 (08/04 2200)  Labs: Recent Labs    06/05/19 2024 06/05/19 2247 06/06/19 1656 06/06/19 1842 06/06/19 2053  HGB 10.5*  --  9.9*  --   --   HCT 28.5*  --  27.9*  --   --   PLT 310  --  277  --   --   APTT  --  31  --   --   --   LABPROT  --  14.7  --   --   --   INR  --  1.2  --   --   --   CREATININE 0.73  --   --   --   --   TROPONINIHS  --   --  603* 871* 1,065*    Estimated Creatinine Clearance: 82.9 mL/min (by C-G formula based on SCr of 0.73 mg/dL).   Medical History: Past Medical History:  Diagnosis Date  . Sickle cell anemia (HCC)   . Stroke [facility])     Medications:  Scheduled:  . aspirin EC  81 mg Oral Daily  . atorvastatin  80 mg Oral q1800  . diclofenac sodium  2 g Topical QID  . fondaparinux (ARIXTRA) injection  2.5 mg Subcutaneous Q0600  . HYDROmorphone   Intravenous Q4H  . hydroxyurea  1,500 mg Oral Daily  . L-glutamine  10 g Oral BID  . metoprolol tartrate  12.5 mg Oral BID  . pantoprazole  40 mg Oral Daily  . senna-docusate  1 tablet Oral BID  . voxelotor  1,500 mg Oral Daily   Infusions:  . sodium chloride 100 mL/hr at 06/06/19 1658  . ceFEPime (MAXIPIME) IV 2 g (06/06/19 2254)  . [START ON 06/07/2019] vancomycin      Assessment: 89 yoF with sickle cell now with elevated troponins. 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     Plan:  Arixtra 2.5 mg daily  Dorrene German 06/06/2019,10:58 PM

## 2019-06-06 NOTE — ED Notes (Addendum)
Pt ambulated and performed personal hygiene

## 2019-06-07 ENCOUNTER — Encounter (HOSPITAL_COMMUNITY): Payer: Self-pay | Admitting: Cardiology

## 2019-06-07 ENCOUNTER — Inpatient Hospital Stay (HOSPITAL_COMMUNITY): Payer: Medicare Other

## 2019-06-07 DIAGNOSIS — R079 Chest pain, unspecified: Secondary | ICD-10-CM

## 2019-06-07 DIAGNOSIS — I6522 Occlusion and stenosis of left carotid artery: Secondary | ICD-10-CM

## 2019-06-07 DIAGNOSIS — I249 Acute ischemic heart disease, unspecified: Secondary | ICD-10-CM

## 2019-06-07 DIAGNOSIS — G894 Chronic pain syndrome: Secondary | ICD-10-CM | POA: Diagnosis present

## 2019-06-07 DIAGNOSIS — D638 Anemia in other chronic diseases classified elsewhere: Secondary | ICD-10-CM | POA: Diagnosis present

## 2019-06-07 DIAGNOSIS — T508X5D Adverse effect of diagnostic agents, subsequent encounter: Secondary | ICD-10-CM

## 2019-06-07 DIAGNOSIS — T508X5A Adverse effect of diagnostic agents, initial encounter: Secondary | ICD-10-CM

## 2019-06-07 DIAGNOSIS — D5700 Hb-SS disease with crisis, unspecified: Principal | ICD-10-CM

## 2019-06-07 DIAGNOSIS — I779 Disorder of arteries and arterioles, unspecified: Secondary | ICD-10-CM | POA: Diagnosis present

## 2019-06-07 DIAGNOSIS — D5701 Hb-SS disease with acute chest syndrome: Secondary | ICD-10-CM | POA: Diagnosis present

## 2019-06-07 LAB — LACTIC ACID, PLASMA: Lactic Acid, Venous: 1.7 mmol/L (ref 0.5–1.9)

## 2019-06-07 LAB — ECHOCARDIOGRAM COMPLETE
Height: 64 in
Weight: 1888 oz

## 2019-06-07 LAB — COMPREHENSIVE METABOLIC PANEL
ALT: 72 U/L — ABNORMAL HIGH (ref 0–44)
AST: 73 U/L — ABNORMAL HIGH (ref 15–41)
Albumin: 3.5 g/dL (ref 3.5–5.0)
Alkaline Phosphatase: 57 U/L (ref 38–126)
Anion gap: 8 (ref 5–15)
BUN: 11 mg/dL (ref 6–20)
CO2: 21 mmol/L — ABNORMAL LOW (ref 22–32)
Calcium: 8.2 mg/dL — ABNORMAL LOW (ref 8.9–10.3)
Chloride: 104 mmol/L (ref 98–111)
Creatinine, Ser: 0.63 mg/dL (ref 0.44–1.00)
GFR calc Af Amer: >60 mL/min (ref >60)
GFR calc non Af Amer: >60 mL/min (ref >60)
Glucose, Bld: 117 mg/dL — ABNORMAL HIGH (ref 70–99)
Potassium: 5 mmol/L (ref 3.5–5.1)
Sodium: 133 mmol/L — ABNORMAL LOW (ref 135–145)
Total Bilirubin: 2.8 mg/dL — ABNORMAL HIGH (ref 0.3–1.2)
Total Protein: 7 g/dL (ref 6.5–8.1)

## 2019-06-07 LAB — HIV ANTIBODY (ROUTINE TESTING W REFLEX): HIV Screen 4th Generation wRfx: NONREACTIVE

## 2019-06-07 LAB — HEMOGLOBIN A1C
Hgb A1c MFr Bld: <4.2 % — ABNORMAL LOW (ref 4.8–5.6)
Mean Plasma Glucose: <74 mg/dL

## 2019-06-07 MED ORDER — SODIUM CHLORIDE 0.45 % IV SOLN: INTRAVENOUS | Status: AC

## 2019-06-07 MED ORDER — VOXELOTOR 500 MG PO TABS
1500.0000 mg | ORAL_TABLET | Freq: Every day | ORAL | Status: DC
  Administered 2019-06-07 – 2019-06-10 (×4): 1500 mg via ORAL

## 2019-06-07 NOTE — Progress Notes (Signed)
  Fax sent to [facility] in [place], requesting medical records (12 lead EKG). Fax sent to [phone]. Unit secretary and RN notified to be on the lookout for incoming fax and to place in paper chart for later review.

## 2019-06-07 NOTE — Plan of Care (Signed)
°  Problem: Clinical Measurements: °Goal: Will remain free from infection °Outcome: Progressing °  °Problem: Activity: °Goal: Risk for activity intolerance will decrease °Outcome: Progressing °  °Problem: Nutrition: °Goal: Adequate nutrition will be maintained °Outcome: Progressing °  °
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
## 2019-06-07 NOTE — Progress Notes (Signed)
  Echocardiogram 2D Echocardiogram has been performed.  Adrienne Miller 06/07/2019, 10:03 AM

## 2019-06-07 NOTE — Progress Notes (Signed)
Patient ID: Adrienne Miller, female   DOB: May 02, 1981, 38 y.o.   MRN: [id number] Subjective: Adrienne Miller is a 38 y.o. female with a hx of sickle cell anemia, TIA in 2018, known carotid artery disease (occluded left ICA), severe contrast allergy (anaphylaxis) and pork allergy  who was admitted yesterday for sickle cell pain crisis.  She continues to have pain that is localized to her rib bilaterally, mostly under her breasts on the side of her left chest area, described as discomfort, worse with movement and deep breathing.  She also had fever yesterday coupled with changes in her chest x-ray that warranted start of IV broad-spectrum antibiotics.    She denies any dizziness, no shortness of breath, but has pain on deep inspiration denies any nausea, vomiting or diarrhea he has decreased appetite. Objective:  Vital signs in last 24 hours:  Vitals:   06/07/19 1217 06/07/19 1230 06/07/19 1514 06/07/19 1606  BP: (!) 90/59   (!) 99/59  Pulse: 85   96  Resp: 16 15 16 18   Temp: 99.1 F (37.3 C)   99.6 F (37.6 C)  TempSrc: Oral   Oral  SpO2: 93% 94% 97% 93%  Weight:      Height:        Intake/Output from previous day:   Intake/Output Summary (Last 24 hours) at 06/07/2019 1756 Last data filed at 06/07/2019 1100 Gross per 24 hour  Intake 1792.88 ml  Output -  Net 1792.88 ml    Physical Exam: General: Alert, awake, oriented x3, in no acute distress.  HEENT: [employer]/AT PEERL, EOMI Neck: Trachea midline,  no masses, no thyromegal,y no JVD, no carotid bruit OROPHARYNX:  Moist, No exudate/ erythema/lesions.  Heart: Regular rate and rhythm, without murmurs, rubs, gallops, PMI non-displaced, no heaves or thrills on palpation.  Lungs: Clear to auscultation, no wheezing or rhonchi noted. No increased vocal fremitus resonant to percussion  Abdomen: Soft, nontender, nondistended, positive bowel sounds, no masses no hepatosplenomegaly noted..  Neuro: No focal neurological deficits noted cranial nerves II  through XII grossly intact. DTRs 2+ bilaterally upper and lower extremities. Strength 5 out of 5 in bilateral upper and lower extremities. Musculoskeletal: No warm swelling or erythema around joints, no spinal tenderness noted. Psychiatric: Patient alert and oriented x3, good insight and cognition, good recent to remote recall. Lymph node survey: No cervical axillary or inguinal lymphadenopathy noted.  Lab Results:  Basic Metabolic Panel:    Component Value Date/Time   NA 133 (L) 06/07/2019 0141   K 5.0 06/07/2019 0141   CL 104 06/07/2019 0141   CO2 21 (L) 06/07/2019 0141   BUN 11 06/07/2019 0141   CREATININE 0.63 06/07/2019 0141   GLUCOSE 117 (H) 06/07/2019 0141   CALCIUM 8.2 (L) 06/07/2019 0141   CBC:    Component Value Date/Time   WBC 15.5 (H) 06/06/2019 1656   HGB 9.9 (L) 06/06/2019 1656   HCT 27.9 (L) 06/06/2019 1656   PLT 277 06/06/2019 1656   MCV 104.5 (H) 06/06/2019 1656   NEUTROABS 12.7 (H) 06/06/2019 1656   LYMPHSABS 0.7 06/06/2019 1656   MONOABS 2.0 (H) 06/06/2019 1656   EOSABS 0.0 06/06/2019 1656   BASOSABS 0.0 06/06/2019 1656    Recent Results (from the past 240 hour(s))  SARS CORONAVIRUS 2 Nasal Swab Aptima Multi Swab     Status: None   Collection Time: 06/05/19 10:58 PM   Specimen: Aptima Multi Swab; Nasal Swab  Result Value Ref Range Status   SARS Coronavirus 2 NEGATIVE  NEGATIVE Final    Comment: (NOTE) SARS-CoV-2 target nucleic acids are NOT DETECTED. The SARS-CoV-2 RNA is generally detectable in upper and lower respiratory specimens during the acute phase of infection. Negative results do not preclude SARS-CoV-2 infection, do not rule out co-infections with other pathogens, and should not be used as the sole basis for treatment or other patient management decisions. Negative results must be combined with clinical observations, patient history, and epidemiological information. The expected result is Negative. Fact Sheet for  Patients: HairSlick.nohttps://www.fda.gov/media/138098/download Fact Sheet for Healthcare Providers: quierodirigir.comhttps://www.fda.gov/media/138095/download This test is not yet approved or cleared by the [place] FDA and  has been authorized for detection and/or diagnosis of SARS-CoV-2 by FDA under an Emergency Use Authorization (EUA). This EUA will remain  in effect (meaning this test can be used) for the duration of the COVID-19 declaration under Section 56 4(b)(1) of the Act, 21 U.S.C. section 360bbb-3(b)(1), unless the authorization is terminated or revoked sooner. Performed at [facility], [address] [place], [place] [address]   Culture, blood (routine x 2)     Status: None (Preliminary result)   Collection Time: 06/06/19  8:52 PM   Specimen: BLOOD RIGHT HAND  Result Value Ref Range Status   Specimen Description   Final    BLOOD RIGHT HAND Performed at [facility], [address] [place], [place] [address]    Special Requests   Final    BOTTLES DRAWN AEROBIC ONLY Blood Culture adequate volume Performed at [facility], [address] [place], [place] [address]    Culture   Final    NO GROWTH < 24 HOURS Performed at [facility], [address] [place], [place] [address]    Report Status PENDING  Incomplete  Culture, blood (routine x 2)     Status: None (Preliminary result)   Collection Time: 06/06/19  8:53 PM   Specimen: BLOOD RIGHT HAND  Result Value Ref Range Status   Specimen Description   Final    BLOOD RIGHT HAND Performed at [facility], [address] [place], [place] [address]    Special Requests   Final    BOTTLES DRAWN AEROBIC ONLY Blood Culture adequate volume Performed at [facility], [address] [place], [place] [address]    Culture   Final    NO GROWTH < 24 HOURS Performed at [facility], [address] [place], [place] [address]    Report Status PENDING   Incomplete    Studies/Results: Dg Chest 2 View  Result Date: 06/07/2019 CLINICAL DATA:  Chest pain, sickle cell EXAM: CHEST - 2 VIEW COMPARISON:  06/05/2019 FINDINGS: Mild cardiomegaly, unchanged. Interval progression of diffuse bilateral airspace opacities. No pleural effusion. No pneumothorax. No acute osseous abnormality. IMPRESSION: Interval progression of diffuse bilateral airspace opacities which may represent worsening edema and/or infection in the appropriate clinical setting. Electronically Signed   By: Duanne GuessNicholas  Plundo M.D.   On: 06/07/2019 10:18   Ct Angio Chest Pe W/cm &/or Wo Cm  Result Date: 06/06/2019 CLINICAL DATA:  Chest pain. History of sickle cell disease. Larey SeatFell off scooter yesterday. Right rib pain. EXAM: CT ANGIOGRAPHY CHEST WITH CONTRAST TECHNIQUE: Multidetector CT imaging of the chest was performed using the standard protocol during bolus administration of intravenous contrast. Multiplanar CT image reconstructions and MIPs were obtained to evaluate the vascular anatomy. CONTRAST:  100mL OMNIPAQUE IOHEXOL 350 MG/ML SOLN COMPARISON:  None FINDINGS: Cardiovascular: No filling  defects in the pulmonary arteries to suggestpulmonary emboli. Mild cardiomegaly. Aorta normal caliber. Mediastinum/Nodes: No mediastinal, hilar, or axillary adenopathy. Trachea and esophagus are unremarkable. Lungs/Pleura: Mild central vascular congestion. Ground-glass opacities noted centrally within the lungs could reflect early edema. No confluent opacity or effusion. Upper Abdomen: Imaging into the upper abdomen shows no acute findings. Musculoskeletal: Chest wall soft tissues are unremarkable. No acute bony abnormality. Review of the MIP images confirms the above findings. IMPRESSION: Mild cardiomegaly. No evidence of pulmonary embolus. Central vascular congestion and perihilar ground-glass opacities which could reflect early edema. Electronically Signed   By: Charlett NoseKevin  Dover M.D.   On: 06/06/2019 00:30   Dg Chest  Port 1 View  Result Date: 06/05/2019 CLINICAL DATA:  Sickle cell patient, presentation concerning for sickle cell pain crisis EXAM: PORTABLE CHEST 1 VIEW COMPARISON:  None. FINDINGS: Multifocal areas of airspace opacity throughout both lungs most pronounced in the right base and retrocardiac region. No features of edema, pneumothorax, or effusion. Pulmonary vascularity is normally distributed. The cardiomediastinal contours are unremarkable. No acute osseous or soft tissue abnormality. IMPRESSION: Multifocal areas of airspace opacity throughout both lungs, concerning for multifocal pneumonia or acute chest syndrome Electronically Signed   By: Kreg ShropshirePrice  DeHay M.D.   On: 06/05/2019 22:50    Medications: Scheduled Meds: . aspirin EC  81 mg Oral Daily  . atorvastatin  80 mg Oral q1800  . diclofenac sodium  2 g Topical QID  . fondaparinux (ARIXTRA) injection  2.5 mg Subcutaneous Q0600  . HYDROmorphone   Intravenous Q4H  . hydroxyurea  1,500 mg Oral Daily  . L-glutamine  10 g Oral BID  . metoprolol tartrate  12.5 mg Oral BID  . pantoprazole  40 mg Oral Daily  . senna-docusate  1 tablet Oral BID  . voxelotor  1,500 mg Oral Q supper   Continuous Infusions: . sodium chloride    . ceFEPime (MAXIPIME) IV 2 g (06/07/19 1513)  . vancomycin 750 mg (06/07/19 1037)   PRN Meds:.acetaminophen, ALPRAZolam, docusate sodium, naloxone **AND** sodium chloride flush, ondansetron (ZOFRAN) IV, polyethylene glycol  Consultants:  Cardiology  Procedures:  2D echocardiogram  Antibiotics:  IV cefepime and IV vancomycin  Assessment/Plan: Principal Problem:   Sickle cell pain crisis (HCC) Active Problems:   Carotid artery disease (HCC) - occluded left ICA   Allergic reaction to contrast dye  1. ACS versus demand ischemia: Patient has highly elevated troponin.  Appreciate cardiology input. I reviewed the echocardiogram report, largely normal with normal left ventricular ejection fraction of 55 to 60%. Patient  now on low-dose beta-blocker, aspirin and fondaparinux. Patient is hemodynamically stable.  We will continue to monitor very closely trend down troponin. 2. Hb Sickle Cell Disease with crisis: Continue IVF D5 .45% Saline @ 100 mls/hour, continue weight based Dilaudid PCA, IV Toradol contraindicated because of concurrent use of full dose anticoagulation, Monitor vitals very closely, Re-evaluate pain scale regularly, 2 L of Oxygen by [address]. 3. Acute chest syndrome versus early multifocal pneumonia: Chest x-ray showed "Interval progression of diffuse bilateral airspace opacities which may represent worsening edema and/or infection in the appropriate clinical setting".  Patient is currently on IV cefepime and vancomycin, will continue.  Follow up with blood culture. 4. Leukocytosis: Patient on antibiotics, will continue. 5. Sickle Cell Anemia: Hemoglobin is 9.9, close to baseline per patient.  We will continue to monitor.  No clinical indication for blood transfusion at this time. 6. Chronic pain Syndrome: Restart home pain medication.  Patient counseled. 7. Recent  Mechanical Fall: No active bleeding or fracture. Patient is hemodynamically stable. Continue to monitor 8. History of TIA and Left Internal Carotid Artery Occlusion: Continue aspirin.   Code Status: Full Code Family Communication: N/A Disposition Plan: Not yet ready for discharge  Antoni Stefan  If 7PM-7AM, please contact night-coverage.  06/07/2019, 5:56 PM  LOS: 1 day

## 2019-06-08 ENCOUNTER — Inpatient Hospital Stay (HOSPITAL_COMMUNITY): Payer: Medicare Other

## 2019-06-08 LAB — COMPREHENSIVE METABOLIC PANEL
ALT: 83 U/L — ABNORMAL HIGH (ref 0–44)
AST: 59 U/L — ABNORMAL HIGH (ref 15–41)
Albumin: 3.3 g/dL — ABNORMAL LOW (ref 3.5–5.0)
Alkaline Phosphatase: 55 U/L (ref 38–126)
Anion gap: 7 (ref 5–15)
BUN: 12 mg/dL (ref 6–20)
CO2: 21 mmol/L — ABNORMAL LOW (ref 22–32)
Calcium: 8.5 mg/dL — ABNORMAL LOW (ref 8.9–10.3)
Chloride: 106 mmol/L (ref 98–111)
Creatinine, Ser: 0.7 mg/dL (ref 0.44–1.00)
GFR calc Af Amer: >60 mL/min (ref >60)
GFR calc non Af Amer: >60 mL/min (ref >60)
Glucose, Bld: 119 mg/dL — ABNORMAL HIGH (ref 70–99)
Potassium: 3.9 mmol/L (ref 3.5–5.1)
Sodium: 134 mmol/L — ABNORMAL LOW (ref 135–145)
Total Bilirubin: 1.9 mg/dL — ABNORMAL HIGH (ref 0.3–1.2)
Total Protein: 7 g/dL (ref 6.5–8.1)

## 2019-06-08 LAB — CBC WITH DIFFERENTIAL/PLATELET
Abs Immature Granulocytes: 0.16 10*3/uL — ABNORMAL HIGH (ref 0.00–0.07)
Basophils Absolute: 0 10*3/uL (ref 0.0–0.1)
Basophils Relative: 0 %
Eosinophils Absolute: 0.1 10*3/uL (ref 0.0–0.5)
Eosinophils Relative: 0 %
HCT: 25.5 % — ABNORMAL LOW (ref 36.0–46.0)
Hemoglobin: 9.1 g/dL — ABNORMAL LOW (ref 12.0–15.0)
Immature Granulocytes: 1 %
Lymphocytes Relative: 7 %
Lymphs Abs: 1 10*3/uL (ref 0.7–4.0)
MCH: 37.6 pg — ABNORMAL HIGH (ref 26.0–34.0)
MCHC: 35.7 g/dL (ref 30.0–36.0)
MCV: 105.4 fL — ABNORMAL HIGH (ref 80.0–100.0)
Monocytes Absolute: 1.1 10*3/uL — ABNORMAL HIGH (ref 0.1–1.0)
Monocytes Relative: 8 %
Neutro Abs: 12 10*3/uL — ABNORMAL HIGH (ref 1.7–7.7)
Neutrophils Relative %: 84 %
Platelets: 292 10*3/uL (ref 150–400)
RBC: 2.42 MIL/uL — ABNORMAL LOW (ref 3.87–5.11)
RDW: 18.6 % — ABNORMAL HIGH (ref 11.5–15.5)
WBC: 14.3 10*3/uL — ABNORMAL HIGH (ref 4.0–10.5)
nRBC: 2 % — ABNORMAL HIGH (ref 0.0–0.2)

## 2019-06-08 LAB — LIPID PANEL
Cholesterol: 91 mg/dL (ref 0–200)
HDL: 33 mg/dL — ABNORMAL LOW (ref >40)
LDL Cholesterol: 47 mg/dL (ref 0–99)
Total CHOL/HDL Ratio: 2.8 RATIO
Triglycerides: 54 mg/dL (ref <150)
VLDL: 11 mg/dL (ref 0–40)

## 2019-06-08 NOTE — Progress Notes (Addendum)
Progress Note  Patient Name: Adrienne Miller Date of Encounter: 06/08/2019  Primary Cardiologist: Rollene RotundaJames Alphonsus Doyel, MD   Subjective   Feeling a little bit better. Less chest pain but still significant bilateral leg pain. She still is SOB w/ activity.   Inpatient Medications    Scheduled Meds: . aspirin EC  81 mg Oral Daily  . atorvastatin  80 mg Oral q1800  . diclofenac sodium  2 g Topical QID  . fondaparinux (ARIXTRA) injection  2.5 mg Subcutaneous Q0600  . HYDROmorphone   Intravenous Q4H  . hydroxyurea  1,500 mg Oral Daily  . L-glutamine  10 g Oral BID  . metoprolol tartrate  12.5 mg Oral BID  . pantoprazole  40 mg Oral Daily  . senna-docusate  1 tablet Oral BID  . voxelotor  1,500 mg Oral Q supper   Continuous Infusions: . sodium chloride 100 mL/hr at 06/07/19 1814  . ceFEPime (MAXIPIME) IV 2 g (06/08/19 0502)  . vancomycin 750 mg (06/08/19 0915)   PRN Meds: acetaminophen, ALPRAZolam, docusate sodium, naloxone **AND** sodium chloride flush, ondansetron (ZOFRAN) IV, polyethylene glycol   Vital Signs    Vitals:   06/08/19 0104 06/08/19 0428 06/08/19 0754 06/08/19 0810  BP: (!) 98/59 (!) 92/51 93/64   Pulse:  78 83   Resp:  16 18 16   Temp:  98.9 F (37.2 C) 98.4 F (36.9 C)   TempSrc:  Oral Oral   SpO2:  100% 100% 100%  Weight:      Height:        Intake/Output Summary (Last 24 hours) at 06/08/2019 1101 Last data filed at 06/08/2019 0618 Gross per 24 hour  Intake 3466.85 ml  Output -  Net 3466.85 ml   Last 3 Weights 06/05/2019  Weight (lbs) 118 lb  Weight (kg) 53.524 kg      Telemetry    NSR 90s - Personally Reviewed  ECG    Not performed today - Personally Reviewed  Physical Exam   GEN: thin, young AAF in No acute distress.   Neck: No JVD Cardiac: RRR, no murmurs, rubs, or gallops.  Respiratory: Clear to auscultation bilaterally. GI: Soft, nontender, non-distended  MS: No edema; No deformity. Neuro:  Nonfocal  Psych: Normal affect   Labs     High Sensitivity Troponin:   Recent Labs  Lab 06/06/19 1656 06/06/19 1842 06/06/19 2053 06/06/19 2212  TROPONINIHS 603* 871* 1,065* 1,157*      Cardiac EnzymesNo results for input(s): TROPONINI in the last 168 hours. No results for input(s): TROPIPOC in the last 168 hours.   Chemistry Recent Labs  Lab 06/05/19 2024 06/07/19 0141 06/08/19 0519  NA 133* 133* 134*  K 4.4 5.0 3.9  CL 100 104 106  CO2 22 21* 21*  GLUCOSE 181* 117* 119*  BUN 6 11 12   CREATININE 0.73 0.63 0.70  CALCIUM 8.8* 8.2* 8.5*  PROT 8.0 7.0 7.0  ALBUMIN 4.0 3.5 3.3*  AST 50* 73* 59*  ALT 58* 72* 83*  ALKPHOS 73 57 55  BILITOT 2.0* 2.8* 1.9*  GFRNONAA >60 >60 >60  GFRAA >60 >60 >60  ANIONGAP 11 8 7      Hematology Recent Labs  Lab 06/05/19 2024 06/06/19 1656 06/08/19 0519  WBC 13.5* 15.5* 14.3*  RBC 2.80*  2.80* 2.67* 2.42*  HGB 10.5* 9.9* 9.1*  HCT 28.5* 27.9* 25.5*  MCV 101.8* 104.5* 105.4*  MCH 37.5* 37.1* 37.6*  MCHC 36.8* 35.5 35.7  RDW 19.2* 19.8* 18.6*  PLT 310 277  292    BNPNo results for input(s): BNP, PROBNP in the last 168 hours.   DDimer No results for input(s): DDIMER in the last 168 hours.   Radiology    Dg Chest 2 View  Result Date: 06/08/2019 CLINICAL DATA:  Sickle cell flare up, chest pain, infiltrate EXAM: CHEST - 2 VIEW COMPARISON:  06/07/2019 FINDINGS: Enlargement of cardiac silhouette with pulmonary vascular congestion. Accentuated markings throughout both lungs again seen, question diffuse edema or atypical infection. Tiny LEFT pleural effusion. No pneumothorax or acute osseous findings. IMPRESSION: Enlargement of cardiac silhouette and vascular congestion consistent with history of sickle cell disease. Diffuse interstitial infiltrates which could represent edema or atypical infection, not significantly changed since 06/07/2019. Electronically Signed   By: Lavonia Dana M.D.   On: 06/08/2019 09:38   Dg Chest 2 View  Result Date: 06/07/2019 CLINICAL DATA:  Chest pain,  sickle cell EXAM: CHEST - 2 VIEW COMPARISON:  06/05/2019 FINDINGS: Mild cardiomegaly, unchanged. Interval progression of diffuse bilateral airspace opacities. No pleural effusion. No pneumothorax. No acute osseous abnormality. IMPRESSION: Interval progression of diffuse bilateral airspace opacities which may represent worsening edema and/or infection in the appropriate clinical setting. Electronically Signed   By: Davina Poke M.D.   On: 06/07/2019 10:18    Cardiac Studies   Bilateral Carotid Artery Dopplers 05/2017 [facility] in [place])  INTERPRETATION/FINDINGS PROCEDURE:  Evaluation of the extracranial cerebrovascular arteries with ultrasound (B-mode imaging, pulsed Doppler, color Doppler). Includes the common carotid, internal carotid, external carotid, and vertebral arteries.  FINDINGS: Gray-scale and color flow duplex images of the extracranial,  carotid, and vertebral arteries were performed bilaterally. Gray scale analysis shows an occluded internal carotid artery on the left with mild plaquing in the 0% to 49% range on the right. Color flow analysis shows decreased color flow in the areas of plaquing on the left. Pulsed wave Doppler shows no focal velocity shifts.  Patent vertebral arteries with normal antegrade bilaterally.  CONCLUSION: 1.Occluded left internal carotid artery without certainty of whether it is acute or chronic. 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 3.Patent vertebral arteries with antegrade flow bilaterally.  2D Echo 06/07/19  IMPRESSIONS    1. The left ventricle has normal systolic function, with an ejection fraction of 55-60%. The cavity size was normal. Left ventricular diastolic parameters were normal.  2. The right ventricle has normal systolic function. The cavity was normal. There is no increase in right ventricular wall thickness.  3. The aorta is normal in size and structure.  4. The aortic root, ascending aorta and  aortic arch are normal in size and structure.  5. The atrial septum is grossly normal.  FINDINGS  Left Ventricle: The left ventricle has normal systolic function, with an ejection fraction of 55-60%. The cavity size was normal. There is no increase in left ventricular wall thickness. Left ventricular diastolic parameters were normal.  Right Ventricle: The right ventricle has normal systolic function. The cavity was normal. There is no increase in right ventricular wall thickness.  Left Atrium: Left atrial size was normal in size.  Right Atrium: Right atrial size was normal in size. Right atrial pressure is estimated at 10 mmHg.  Interatrial Septum: The atrial septum is grossly normal.  Pericardium: There is no evidence of pericardial effusion.  Mitral Valve: The mitral valve is normal in structure. Mitral valve regurgitation is trivial by color flow Doppler.  Tricuspid Valve: The tricuspid valve is normal in structure. Tricuspid valve regurgitation is trivial  by color flow Doppler.  Aortic Valve: The aortic valve is normal in structure. Aortic valve regurgitation was not visualized by color flow Doppler.  Pulmonic Valve: The pulmonic valve was grossly normal. Pulmonic valve regurgitation is trivial by color flow Doppler.  Aorta: The aortic root, ascending aorta and aortic arch are normal in size and structure. The aorta is normal in size and structure.   Patient Profile     Adrienne Miller is a 38 y.o. female with a hx of sickle cell anemia, TIA in 2018, known carotid artery disease (occluded left ICA), severe contrast allergy (anaphylaxis) and pork allergy who is being seen today for the evaluation of chest pain and elevated Hs Troponin at the request of Dr. Hyman HopesJegede, Internal Medicine.   Assessment & Plan    1. Chest Pain w/ Elevated Hs Troponin: Differential diagnosis includes acute chest syndrome/ Sickle Cell Crisis vs Acute Coronary Syndrome. PE has been ruled out by  chest CT. She has risk factors for CAD including family history (father w/ MI in his 4360s) as well as known premature PVD. In 2018, she was treated for a TIA and found to have totally occluded left ICA. Her chest pain symptoms seem more in line w/ sickle cell crisis but given risk factors and Hs Troponin trend, 603 ng/L>>871>>1,065>>1,157, will need to r/o ACS/ Type I NSTEMI. Her EKG shows sinus tach w/ some ST/ T wave abnormalties. No prior EKGs for comparison. [facility] medical records/ [facility]KGs [facility] but no records received today. Will try again to obtain prior EKG for comparison. Echo done yesterday showed normal LVEF, 55-60%, normal diastolic parameters and normal RV. Agree w/ anticoagulation. Unable to use IV heparin due to pork allergy. She is on fondoparinux (Arixtra) 2.5 mg [place] daily and pharmacy is following. Also continue ASA, statin and metoprolol for now. Unfortunately, she is not a good candidate for coronary CT or cath given severe contrast dye allergy (anaphylaxis). Plan is to do NST to r/o coronary ischemia once she is feeling a bit better.    For questions or updates, please contact [facility] Please consult [email or web address] for contact info under      Signed, Robbie LisBrittainy Simmons, PA-C  06/08/2019, 11:01 AM    History and all data above reviewed.  She reports that she is not getting chest pain.  No acute SOB.   Patient examined.  I agree with the findings as above.  The patient exam reveals COR:RRR  ,  Lungs: Clear  ,  Abd: Positive bowel sounds, no rebound no guarding, Ext No edema  .  All available labs, radiology testing, previous records reviewed. Agree with documented assessment and plan. Chest pain:  I suspect that she has small vessel involvement from the sickle cell.  I will plan for a Lexiscan Myoview after discharge given her high risk for any contrast study.  I will check hs Trop again tomorrow to make sure that it is trending down.  The echo  was as above. She is not having any further chest pain.   Fayrene FearingJames Holiday Mcmenamin  11:44 AM  06/08/2019

## 2019-06-08 NOTE — Progress Notes (Signed)
Patient ID: Adrienne Miller, female   DOB: 03-20-1981, 38 y.o.   MRN: [id number] Subjective: Adrienne Miller is a 38 y.o. female with a hx of sickle cell anemia, TIA in 2018, known carotid artery disease (occluded left ICA), severe contrast allergy (anaphylaxis) and pork allergy  who was admitted yesterday for sickle cell pain crisis.  Patient feels much better today, chest pain almost resolved, breathing much better.  Fever has subsided.  No cough, no fever.    She denies any dizziness, no shortness of breath, denies any nausea, vomiting or diarrhea.  She said her appetite has returned and now eating well with no restrictions. Objective:  Vital signs in last 24 hours:  Vitals:   06/08/19 0754 06/08/19 0810 06/08/19 1201 06/08/19 1201  BP: 93/64   (!) 92/51  Pulse: 83   82  Resp: 18 16 16 16   Temp: 98.4 F (36.9 C)   98.4 F (36.9 C)  TempSrc: Oral   Oral  SpO2: 100% 100% 99% 100%  Weight:      Height:        Intake/Output from previous day:   Intake/Output Summary (Last 24 hours) at 06/08/2019 1413 Last data filed at 06/08/2019 1200 Gross per 24 hour  Intake 3466.85 ml  Output -  Net 3466.85 ml    Physical Exam: General: Alert, awake, oriented x3, in no acute distress.  HEENT: [place]/AT PEERL, EOMI Neck: Trachea midline,  no masses, no thyromegal,y no JVD, no carotid bruit OROPHARYNX:  Moist, No exudate/ erythema/lesions.  Heart: Regular rate and rhythm, without murmurs, rubs, gallops, PMI non-displaced, no heaves or thrills on palpation.  Lungs: Clear to auscultation, no wheezing or rhonchi noted. No increased vocal fremitus resonant to percussion  Abdomen: Soft, nontender, nondistended, positive bowel sounds, no masses no hepatosplenomegaly noted..  Neuro: No focal neurological deficits noted cranial nerves II through XII grossly intact. DTRs 2+ bilaterally upper and lower extremities. Strength 5 out of 5 in bilateral upper and lower extremities. Musculoskeletal: No warm swelling or  erythema around joints, no spinal tenderness noted. Psychiatric: Patient alert and oriented x3, good insight and cognition, good recent to remote recall. Lymph node survey: No cervical axillary or inguinal lymphadenopathy noted.  Lab Results:  Basic Metabolic Panel:    Component Value Date/Time   NA 134 (L) 06/08/2019 0519   K 3.9 06/08/2019 0519   CL 106 06/08/2019 0519   CO2 21 (L) 06/08/2019 0519   BUN 12 06/08/2019 0519   CREATININE 0.70 06/08/2019 0519   GLUCOSE 119 (H) 06/08/2019 0519   CALCIUM 8.5 (L) 06/08/2019 0519   CBC:    Component Value Date/Time   WBC 14.3 (H) 06/08/2019 0519   HGB 9.1 (L) 06/08/2019 0519   HCT 25.5 (L) 06/08/2019 0519   PLT 292 06/08/2019 0519   MCV 105.4 (H) 06/08/2019 0519   NEUTROABS 12.0 (H) 06/08/2019 0519   LYMPHSABS 1.0 06/08/2019 0519   MONOABS 1.1 (H) 06/08/2019 0519   EOSABS 0.1 06/08/2019 0519   BASOSABS 0.0 06/08/2019 0519    Recent Results (from the past 240 hour(s))  SARS CORONAVIRUS 2 Nasal Swab Aptima Multi Swab     Status: None   Collection Time: 06/05/19 10:58 PM   Specimen: Aptima Multi Swab; Nasal Swab  Result Value Ref Range Status   SARS Coronavirus 2 NEGATIVE NEGATIVE Final    Comment: (NOTE) SARS-CoV-2 target nucleic acids are NOT DETECTED. The SARS-CoV-2 RNA is generally detectable in upper and lower respiratory specimens during the acute  phase of infection. Negative results do not preclude SARS-CoV-2 infection, do not rule out co-infections with other pathogens, and should not be used as the sole basis for treatment or other patient management decisions. Negative results must be combined with clinical observations, patient history, and epidemiological information. The expected result is Negative. Fact Sheet for Patients: HairSlick.nohttps://www.fda.gov/media/138098/download Fact Sheet for Healthcare Providers: quierodirigir.comhttps://www.fda.gov/media/138095/download This test is not yet approved or cleared by the [place] FDA  and  has been authorized for detection and/or diagnosis of SARS-CoV-2 by FDA under an Emergency Use Authorization (EUA). This EUA will remain  in effect (meaning this test can be used) for the duration of the COVID-19 declaration under Section 56 4(b)(1) of the Act, 21 U.S.C. section 360bbb-3(b)(1), unless the authorization is terminated or revoked sooner. Performed at [facility], [address] [place], [place] [address]   Culture, blood (routine x 2)     Status: None (Preliminary result)   Collection Time: 06/06/19  8:52 PM   Specimen: BLOOD RIGHT HAND  Result Value Ref Range Status   Specimen Description   Final    BLOOD RIGHT HAND Performed at [facility], [address] [place], [place] [address]    Special Requests   Final    BOTTLES DRAWN AEROBIC ONLY Blood Culture adequate volume Performed at [facility], [address] [place], [place] [address]    Culture   Final    NO GROWTH < 24 HOURS Performed at [facility], [address] [place], [place] [address]    Report Status PENDING  Incomplete  Culture, blood (routine x 2)     Status: None (Preliminary result)   Collection Time: 06/06/19  8:53 PM   Specimen: BLOOD RIGHT HAND  Result Value Ref Range Status   Specimen Description   Final    BLOOD RIGHT HAND Performed at [facility], [address] [place], [place] [address]    Special Requests   Final    BOTTLES DRAWN AEROBIC ONLY Blood Culture adequate volume Performed at [facility], [address] [place], [place] [address]    Culture   Final    NO GROWTH < 24 HOURS Performed at [facility], [address] [place], [place] [address]    Report Status PENDING  Incomplete    Studies/Results: Dg Chest 2 View  Result Date: 06/08/2019 CLINICAL DATA:  Sickle cell flare up, chest pain, infiltrate EXAM: CHEST - 2 VIEW COMPARISON:  06/07/2019 FINDINGS: Enlargement  of cardiac silhouette with pulmonary vascular congestion. Accentuated markings throughout both lungs again seen, question diffuse edema or atypical infection. Tiny LEFT pleural effusion. No pneumothorax or acute osseous findings. IMPRESSION: Enlargement of cardiac silhouette and vascular congestion consistent with history of sickle cell disease. Diffuse interstitial infiltrates which could represent edema or atypical infection, not significantly changed since 06/07/2019. Electronically Signed   By: Ulyses SouthwardMark  Boles M.D.   On: 06/08/2019 09:38   Dg Chest 2 View  Result Date: 06/07/2019 CLINICAL DATA:  Chest pain, sickle cell EXAM: CHEST - 2 VIEW COMPARISON:  06/05/2019 FINDINGS: Mild cardiomegaly, unchanged. Interval progression of diffuse bilateral airspace opacities. No pleural effusion. No pneumothorax. No acute osseous abnormality. IMPRESSION: Interval progression of diffuse bilateral airspace opacities which may represent worsening edema and/or infection in the appropriate clinical setting. Electronically Signed   By: Duanne GuessNicholas  Plundo M.D.   On: 06/07/2019 10:18    Medications: Scheduled Meds: . aspirin EC  81 mg Oral Daily  . atorvastatin  80 mg Oral q1800  . diclofenac sodium  2 g Topical QID  . fondaparinux (ARIXTRA) injection  2.5 mg Subcutaneous Q0600  . HYDROmorphone   Intravenous Q4H  . hydroxyurea  1,500 mg Oral Daily  . L-glutamine  10 g Oral BID  . metoprolol tartrate  12.5 mg Oral BID  . pantoprazole  40 mg Oral Daily  . senna-docusate  1 tablet Oral BID  . voxelotor  1,500 mg Oral Q supper   Continuous Infusions: . sodium chloride 100 mL/hr at 06/07/19 1814  . ceFEPime (MAXIPIME) IV 2 g (06/08/19 1341)  . vancomycin 750 mg (06/08/19 0915)   PRN Meds:.acetaminophen, ALPRAZolam, docusate sodium, naloxone **AND** sodium chloride flush, ondansetron (ZOFRAN) IV, polyethylene glycol  Consultants:  Cardiology  Procedures:  2D echocardiogram  Antibiotics:  IV cefepime and IV  vancomycin day 2  Assessment/Plan: Principal Problem:   Sickle cell pain crisis (HCC) Active Problems:   Carotid artery disease (HCC) - occluded left ICA   Allergic reaction to contrast dye   Acute coronary syndrome with high troponin (HCC)   Acute chest syndrome due to sickle cell crisis (HCC)   Anemia of chronic disease   Chronic pain syndrome  1. ACS versus demand ischemia: We will continue low-dose beta-blocker, aspirin and fondaparinux. Patient is hemodynamically stable.  We will continue to monitor very closely, trend down troponin.  Cardiology is planning a Lexiscan Myoview possibly after discharge, as outpatient because of patient's high risk for contrast studies.  Cardiology input much appreciated. 2. Hb Sickle Cell Disease with crisis: Reduce IVF D5 .45% Saline to 75 Mls/hour, continue weight based Dilaudid PCA, IV Toradol contraindicated because of concurrent use of full dose anticoagulation, Monitor vitals very closely, Re-evaluate pain scale regularly, 2 L of Oxygen by [place]. 3. Acute chest syndrome versus early multifocal pneumonia: Continue IV cefepime and vancomycin.  Blood cultures are negative so far.  Will check for MRSA, if negative will discontinue vancomycin. 4. Leukocytosis: Patient on antibiotics, will continue. 5. Sickle Cell Anemia: Hemoglobin is stable, close to baseline per patient.  We will continue to monitor.  No clinical indication for blood transfusion at this time. 6. Chronic pain Syndrome: Continue home pain medication.  Patient counseled. 7. Recent Mechanical Fall: No active bleeding or fracture. Patient is hemodynamically stable. Continue to monitor 8. History of TIA and Left Internal Carotid Artery Occlusion: Continue aspirin.   Code Status: Full Code Family Communication: N/A Disposition Plan: Not yet ready for discharge  Kasey Hansell  If 7PM-7AM, please contact night-coverage.  06/08/2019, 2:13 PM  LOS: 2 days

## 2019-06-09 ENCOUNTER — Other Ambulatory Visit: Payer: Self-pay | Admitting: Cardiology

## 2019-06-09 DIAGNOSIS — R079 Chest pain, unspecified: Secondary | ICD-10-CM

## 2019-06-09 DIAGNOSIS — R0989 Other specified symptoms and signs involving the circulatory and respiratory systems: Secondary | ICD-10-CM

## 2019-06-09 DIAGNOSIS — R918 Other nonspecific abnormal finding of lung field: Secondary | ICD-10-CM

## 2019-06-09 DIAGNOSIS — G894 Chronic pain syndrome: Secondary | ICD-10-CM

## 2019-06-09 LAB — MRSA PCR SCREENING: MRSA by PCR: NEGATIVE

## 2019-06-09 LAB — TROPONIN I (HIGH SENSITIVITY): Troponin I (High Sensitivity): 279 ng/L (ref <18)

## 2019-06-09 NOTE — Progress Notes (Signed)
RN got a phone call from [place] to update that patient will have stress test will be done at [facility] on 06/11/2019. Patient will be NPO at 0001 06/11/2019 and [facility] need to be set up to transfer patient from [place] to [facility] and back on 06/11/2019 at 0800. RN will pass this message along another nurse at night shift to follow up.

## 2019-06-09 NOTE — Care Management Important Message (Signed)
Important Message  Patient Details IM Letter given to Kathrin Greathouse SW to present to the Patient Name: Adrienne Miller MRN: [id number] Date of Birth: 1981/06/28   Medicare Important Message Given:  Yes     Kerin Salen 06/09/2019, 10:10 AM

## 2019-06-09 NOTE — Progress Notes (Signed)
Patient ID: Adrienne Miller, female   DOB: [birth year], 38 y.o.   MRN: [id number] Subjective: Adrienne Miller is a 38 y.o. female with a hx of sickle cell anemia, TIA in 2018, known carotid artery disease (occluded left ICA), severe contrast allergy (anaphylaxis) and pork allergy  who was admitted yesterday for sickle cell pain crisis.  Today patient feels her sickle cell pain is back especially on her lower extremities, her chest pain has resolved but her leg pains are about 6-7 over 10.  She has had no fever for about 48 hours.  She has regained her appetite and tolerating oral intake well.  She is ambulating  She denies any dizziness, no shortness of breath, denies any nausea, vomiting or diarrhea.  She said her appetite has returned and now eating well with no restrictions.  Objective:  Vital signs in last 24 hours:  Vitals:   06/09/19 0749 06/09/19 0937 06/09/19 1204 06/09/19 1322  BP:  (!) 147/77  98/65  Pulse:  88  81  Resp: 14 15 14 19   Temp:  98.9 F (37.2 C)  98.5 F (36.9 C)  TempSrc:  Oral  Oral  SpO2: 100% 100% (!) 4% 100%  Weight:      Height:        Intake/Output from previous day:   Intake/Output Summary (Last 24 hours) at 06/09/2019 1516 Last data filed at 06/09/2019 1500 Gross per 24 hour  Intake 2529.63 ml  Output -  Net 2529.63 ml    Physical Exam: General: Alert, awake, oriented x3, in no acute distress.  HEENT: [place]/AT PEERL, EOMI Neck: Trachea midline,  no masses, no thyromegal,y no JVD, no carotid bruit OROPHARYNX:  Moist, No exudate/ erythema/lesions.  Heart: Regular rate and rhythm, without murmurs, rubs, gallops, PMI non-displaced, no heaves or thrills on palpation.  Lungs: Clear to auscultation, no wheezing or rhonchi noted. No increased vocal fremitus resonant to percussion  Abdomen: Soft, nontender, nondistended, positive bowel sounds, no masses no hepatosplenomegaly noted..  Neuro: No focal neurological deficits noted cranial nerves II through XII  grossly intact. DTRs 2+ bilaterally upper and lower extremities. Strength 5 out of 5 in bilateral upper and lower extremities. Musculoskeletal: No warm swelling or erythema around joints, no spinal tenderness noted. Psychiatric: Patient alert and oriented x3, good insight and cognition, good recent to remote recall. Lymph node survey: No cervical axillary or inguinal lymphadenopathy noted.  Lab Results:  Basic Metabolic Panel:    Component Value Date/Time   NA 134 (L) 06/08/2019 0519   K 3.9 06/08/2019 0519   CL 106 06/08/2019 0519   CO2 21 (L) 06/08/2019 0519   BUN 12 06/08/2019 0519   CREATININE 0.70 06/08/2019 0519   GLUCOSE 119 (H) 06/08/2019 0519   CALCIUM 8.5 (L) 06/08/2019 0519   CBC:    Component Value Date/Time   WBC 14.3 (H) 06/08/2019 0519   HGB 9.1 (L) 06/08/2019 0519   HCT 25.5 (L) 06/08/2019 0519   PLT 292 06/08/2019 0519   MCV 105.4 (H) 06/08/2019 0519   NEUTROABS 12.0 (H) 06/08/2019 0519   LYMPHSABS 1.0 06/08/2019 0519   MONOABS 1.1 (H) 06/08/2019 0519   EOSABS 0.1 06/08/2019 0519   BASOSABS 0.0 06/08/2019 0519    Recent Results (from the past 240 hour(s))  SARS CORONAVIRUS 2 Nasal Swab Aptima Multi Swab     Status: None   Collection Time: 06/05/19 10:58 PM   Specimen: Aptima Multi Swab; Nasal Swab  Result Value Ref Range Status   SARS  Coronavirus 2 NEGATIVE NEGATIVE Final    Comment: (NOTE) SARS-CoV-2 target nucleic acids are NOT DETECTED. The SARS-CoV-2 RNA is generally detectable in upper and lower respiratory specimens during the acute phase of infection. Negative results do not preclude SARS-CoV-2 infection, do not rule out co-infections with other pathogens, and should not be used as the sole basis for treatment or other patient management decisions. Negative results must be combined with clinical observations, patient history, and epidemiological information. The expected result is Negative. Fact Sheet for  Patients: HairSlick.nohttps://www.fda.gov/media/138098/download Fact Sheet for Healthcare Providers: quierodirigir.comhttps://www.fda.gov/media/138095/download This test is not yet approved or cleared by the [place] FDA and  has been authorized for detection and/or diagnosis of SARS-CoV-2 by FDA under an Emergency Use Authorization (EUA). This EUA will remain  in effect (meaning this test can be used) for the duration of the COVID-19 declaration under Section 56 4(b)(1) of the Act, 21 U.S.C. section 360bbb-3(b)(1), unless the authorization is terminated or revoked sooner. Performed at [facility], [address] [place], [place] [address]   Culture, blood (routine x 2)     Status: None (Preliminary result)   Collection Time: 06/06/19  8:52 PM   Specimen: BLOOD RIGHT HAND  Result Value Ref Range Status   Specimen Description   Final    BLOOD RIGHT HAND Performed at [facility], [address] [place], [place] [address]    Special Requests   Final    BOTTLES DRAWN AEROBIC ONLY Blood Culture adequate volume Performed at [facility], [address] [place], [place] [address]    Culture   Final    NO GROWTH 3 DAYS Performed at [facility], [address] [place], [place] [address]    Report Status PENDING  Incomplete  Culture, blood (routine x 2)     Status: None (Preliminary result)   Collection Time: 06/06/19  8:53 PM   Specimen: BLOOD RIGHT HAND  Result Value Ref Range Status   Specimen Description   Final    BLOOD RIGHT HAND Performed at [facility], [address] [place], [place] [address]    Special Requests   Final    BOTTLES DRAWN AEROBIC ONLY Blood Culture adequate volume Performed at [facility], [address] [place], [place] [address]    Culture   Final    NO GROWTH 3 DAYS Performed at [facility], [address] [place], [place] [address]    Report Status PENDING  Incomplete   MRSA PCR Screening     Status: None   Collection Time: 06/09/19 10:28 AM   Specimen: Nasal Mucosa; Nasopharyngeal  Result Value Ref Range Status   MRSA by PCR NEGATIVE NEGATIVE Final    Comment:        The GeneXpert MRSA Assay (FDA approved for NASAL specimens only), is one component of a comprehensive MRSA colonization surveillance program. It is not intended to diagnose MRSA infection nor to guide or monitor treatment for MRSA infections. Performed at [facility], [address] [place], [place] [address]     Studies/Results: Dg Chest 2 View  Result Date: 06/08/2019 CLINICAL DATA:  Sickle cell flare up, chest pain, infiltrate EXAM: CHEST - 2 VIEW COMPARISON:  06/07/2019 FINDINGS: Enlargement of cardiac silhouette with pulmonary vascular congestion. Accentuated markings throughout both lungs again seen, question diffuse edema or atypical infection. Tiny LEFT pleural effusion. No pneumothorax or acute osseous findings. IMPRESSION: Enlargement of  cardiac silhouette and vascular congestion consistent with history of sickle cell disease. Diffuse interstitial infiltrates which could represent edema or atypical infection, not significantly changed since 06/07/2019. Electronically Signed   By: Lavonia Dana M.D.   On: 06/08/2019 09:38    Medications: Scheduled Meds: . aspirin EC  81 mg Oral Daily  . atorvastatin  80 mg Oral q1800  . diclofenac sodium  2 g Topical QID  . fondaparinux (ARIXTRA) injection  2.5 mg Subcutaneous Q0600  . HYDROmorphone   Intravenous Q4H  . hydroxyurea  1,500 mg Oral Daily  . L-glutamine  10 g Oral BID  . metoprolol tartrate  12.5 mg Oral BID  . pantoprazole  40 mg Oral Daily  . senna-docusate  1 tablet Oral BID  . voxelotor  1,500 mg Oral Q supper   Continuous Infusions: . sodium chloride 50 mL/hr at 06/09/19 0000  . ceFEPime (MAXIPIME) IV 2 g (06/09/19 1304)   PRN Meds:.acetaminophen, ALPRAZolam, docusate sodium, naloxone **AND**  sodium chloride flush, ondansetron (ZOFRAN) IV, polyethylene glycol  Consultants:  Cardiology  Procedures:  2D echocardiogram  Antibiotics:  IV cefepime and IV vancomycin day 3  Assessment/Plan: Principal Problem:   Sickle cell pain crisis ([place]) Active Problems:   Carotid artery disease ([place]) - occluded left ICA   Allergic reaction to contrast dye   Acute coronary syndrome with high troponin (HCC)   Acute chest syndrome due to sickle cell crisis (HCC)   Anemia of chronic disease   Chronic pain syndrome  1. ACS versus demand ischemia: Troponin is down to 279 today from 1,157 on 06/06/2019. Continue low-dose beta-blocker, aspirin and fondaparinux. Patient is hemodynamically stable.  We will continue to monitor very closely, trend down troponin.  Cardiology is now planning a [place] NST as inpatient, schedule for Sunday, 06/11/2019.  Cardiology input much appreciated. 2. Hb Sickle Cell Disease with crisis: Continue IVF D5 .45% Saline at 75 Mls/hour, continue weight based Dilaudid PCA, IV Toradol contraindicated because of concurrent use of full dose anticoagulation, Monitor vitals very closely, Re-evaluate pain scale regularly, 2 L of Oxygen by [place]. 3. Acute chest syndrome versus early multifocal pneumonia: MRSA is negative, discontinue IV vancomycin, continue IV cefepime.  Repeat chest x-ray in the morning.  Blood cultures are negative so far.   4. Leukocytosis: Patient on antibiotics, will continue. 5. Sickle Cell Anemia: Hemoglobin is stable, close to baseline per patient.  We will continue to monitor.  No clinical indication for blood transfusion at this time. 6. Chronic pain Syndrome: Continue home pain medication.  Patient counseled. 7. Recent Mechanical Fall: No active bleeding or fracture. Patient is hemodynamically stable. Continue to monitor 8. History of TIA and Left Internal Carotid Artery Occlusion: Continue aspirin.   Code Status: Full Code Family Communication:  N/A Disposition Plan: Not yet ready for discharge  Dynastie Miller  If 7PM-7AM, please contact night-coverage.  06/09/2019, 3:16 PM  LOS: 3 days

## 2019-06-09 NOTE — Plan of Care (Signed)
  Problem: Clinical Measurements: Goal: Ability to maintain clinical measurements within normal limits will improve Outcome: Progressing   Problem: Clinical Measurements: Goal: Cardiovascular complication will be avoided Outcome: Progressing   Problem: Activity: Goal: Risk for activity intolerance will decrease Outcome: Progressing   Problem: Nutrition: Goal: Adequate nutrition will be maintained Outcome: Progressing

## 2019-06-09 NOTE — Progress Notes (Signed)
PHARMACY NOTE -  Cefepime, Arixtra  Pharmacy has been assisting with dosing of cefepime for r/o PNA, and also for fondaparinux for r/o ACS.  Cefepime dosage remains stable at 2g IV q8 hr and need for further dosage adjustment appears unlikely at present given good renal function  Continue fondaparinux at 2.5 mg SQ daily (ACS dosing); expect will continue until discharge  Pharmacy will sign off, following peripherally for culture results or dose adjustments. Please reconsult if a change in clinical status warrants re-evaluation of dosage.  Reuel Boom, PharmD, BCPS [phone] 06/09/2019, 1:58 PM

## 2019-06-09 NOTE — Progress Notes (Signed)
   Stress Test Update: unable to get outpatient NST scheduled at [facility] on Monday. I have notified Dr. Percival Spanish. We will plan to do stress test while inpatient, prior to anticipated discharge. Lexiscan NST is scheduled to take place Sunday, 06/11/19. I have placed NST order and NPO order starting 8/9 0001. Will need transport to [facility] Sunday morning through [facility].  I have updated RN and pt. Will notify primary attending.

## 2019-06-09 NOTE — Progress Notes (Signed)
CRITICAL VALUE ALERT  Critical Value:  Troponin 279  Date & Time Notied:  06/09/19 0539  Provider Notified: J.Kim   Orders Received/Actions taken: called back at 0540 with no new orders

## 2019-06-09 NOTE — Progress Notes (Addendum)
Progress Note  Patient Name: Adrienne Miller Date of Encounter: 06/09/2019  Primary Cardiologist: Rollene RotundaJames Bartlomiej Jenkinson, MD   Subjective   She feels "significantly" better. Less SOB. She was able to bathe at the sink without feeling winded, which is a big improvement. She is hoping she will continue to progress and be able to get discharged before her [birth year] on [birth year].   Inpatient Medications    Scheduled Meds:  aspirin EC  81 mg Oral Daily   atorvastatin  80 mg Oral q1800   diclofenac sodium  2 g Topical QID   fondaparinux (ARIXTRA) injection  2.5 mg Subcutaneous Q0600   HYDROmorphone   Intravenous Q4H   hydroxyurea  1,500 mg Oral Daily   L-glutamine  10 g Oral BID   metoprolol tartrate  12.5 mg Oral BID   pantoprazole  40 mg Oral Daily   senna-docusate  1 tablet Oral BID   voxelotor  1,500 mg Oral Q supper   Continuous Infusions:  sodium chloride 50 mL/hr at 06/09/19 0000   ceFEPime (MAXIPIME) IV 2 g (06/09/19 0553)   vancomycin Stopped (06/08/19 2250)   PRN Meds: acetaminophen, ALPRAZolam, docusate sodium, naloxone **AND** sodium chloride flush, ondansetron (ZOFRAN) IV, polyethylene glycol   Vital Signs    Vitals:   06/08/19 2053 06/08/19 2336 06/09/19 0354 06/09/19 0749  BP: 91/60 (!) 93/56 97/68   Pulse: 88 88 79   Resp: 19 14 18 14   Temp: 99.2 F (37.3 C)  99 F (37.2 C)   TempSrc: Oral  Oral   SpO2: 100%  100% 100%  Weight:      Height:        Intake/Output Summary (Last 24 hours) at 06/09/2019 0918 Last data filed at 06/09/2019 0600 Gross per 24 hour  Intake 2155.79 ml  Output --  Net 2155.79 ml   Last 3 Weights 06/05/2019  Weight (lbs) 118 lb  Weight (kg) 53.524 kg      Telemetry    NSR 84 bpm - Personally Reviewed  ECG    Not performed today - Personally Reviewed  Physical Exam   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   Neck: No JVD Cardiac: RRR, no murmurs, rubs, or gallops.  Respiratory: Clear to auscultation bilaterally. GI:  Soft, nontender, non-distended  MS: No edema; No deformity. Neuro:  Nonfocal  Psych: Normal affect   Labs    High Sensitivity Troponin:   Recent Labs  Lab 06/06/19 1656 06/06/19 1842 06/06/19 2053 06/06/19 2212 06/09/19 0552  TROPONINIHS 603* 871* 1,065* 1,157* 279*      Cardiac EnzymesNo results for input(s): TROPONINI in the last 168 hours. No results for input(s): TROPIPOC in the last 168 hours.   Chemistry Recent Labs  Lab 06/05/19 2024 06/07/19 0141 06/08/19 0519  NA 133* 133* 134*  K 4.4 5.0 3.9  CL 100 104 106  CO2 22 21* 21*  GLUCOSE 181* 117* 119*  BUN 6 11 12   CREATININE 0.73 0.63 0.70  CALCIUM 8.8* 8.2* 8.5*  PROT 8.0 7.0 7.0  ALBUMIN 4.0 3.5 3.3*  AST 50* 73* 59*  ALT 58* 72* 83*  ALKPHOS 73 57 55  BILITOT 2.0* 2.8* 1.9*  GFRNONAA >60 >60 >60  GFRAA >60 >60 >60  ANIONGAP 11 8 7      Hematology Recent Labs  Lab 06/05/19 2024 06/06/19 1656 06/08/19 0519  WBC 13.5* 15.5* 14.3*  RBC 2.80*   2.80* 2.67* 2.42*  HGB 10.5* 9.9* 9.1*  HCT 28.5* 27.9* 25.5*  MCV 101.8* 104.5* 105.4*  MCH 37.5* 37.1* 37.6*  MCHC 36.8* 35.5 35.7  RDW 19.2* 19.8* 18.6*  PLT 310 277 292    BNPNo results for input(s): BNP, PROBNP in the last 168 hours.   DDimer No results for input(s): DDIMER in the last 168 hours.   Radiology    Dg Chest 2 View  Result Date: 06/08/2019 CLINICAL DATA:  Sickle cell flare up, chest pain, infiltrate EXAM: CHEST - 2 VIEW COMPARISON:  06/07/2019 FINDINGS: Enlargement of cardiac silhouette with pulmonary vascular congestion. Accentuated markings throughout both lungs again seen, question diffuse edema or atypical infection. Tiny LEFT pleural effusion. No pneumothorax or acute osseous findings. IMPRESSION: Enlargement of cardiac silhouette and vascular congestion consistent with history of sickle cell disease. Diffuse interstitial infiltrates which could represent edema or atypical infection, not significantly changed since 06/07/2019.  Electronically Signed   By: Lavonia Dana M.D.   On: 06/08/2019 09:38   Dg Chest 2 View  Result Date: 06/07/2019 CLINICAL DATA:  Chest pain, sickle cell EXAM: CHEST - 2 VIEW COMPARISON:  06/05/2019 FINDINGS: Mild cardiomegaly, unchanged. Interval progression of diffuse bilateral airspace opacities. No pleural effusion. No pneumothorax. No acute osseous abnormality. IMPRESSION: Interval progression of diffuse bilateral airspace opacities which may represent worsening edema and/or infection in the appropriate clinical setting. Electronically Signed   By: Davina Poke M.D.   On: 06/07/2019 10:18    Cardiac Studies   Bilateral Carotid Artery Dopplers 05/2017(Outside Hospital in [place])  INTERPRETATION/FINDINGS PROCEDURE: Evaluation of the extracranial cerebrovascular arteries with ultrasound (B-mode imaging, pulsed Doppler, color Doppler). Includes the common carotid, internal carotid, external carotid, and vertebral arteries.  FINDINGS: Gray-scale and color flow duplex images of the extracranial, carotid, and vertebral arteries were performed bilaterally. Gray scale analysis shows an occluded internal carotid artery on the left with mild plaquing in the 0% to 49% range on the right. Color flow analysis shows decreased color flow in the areas of plaquing on the left. Pulsed wave Doppler shows no focal velocity shifts. Patent vertebral arteries with normal antegrade bilaterally.  CONCLUSION: 1.Occluded left internal carotid artery without certainty of whether it is acute or chronic. 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 3.Patent vertebral arteries with antegrade flow bilaterally.  2D Echo 06/07/19  IMPRESSIONS   1. The left ventricle has normal systolic function, with an ejection fraction of 55-60%. The cavity size was normal. Left ventricular diastolic parameters were normal. 2. The right ventricle has normal systolic function. The cavity was normal. There  is no increase in right ventricular wall thickness. 3. The aorta is normal in size and structure. 4. The aortic root, ascending aorta and aortic arch are normal in size and structure. 5. The atrial septum is grossly normal.  FINDINGS Left Ventricle: The left ventricle has normal systolic function, with an ejection fraction of 55-60%. The cavity size was normal. There is no increase in left ventricular wall thickness. Left ventricular diastolic parameters were normal.  Right Ventricle: The right ventricle has normal systolic function. The cavity was normal. There is no increase in right ventricular wall thickness.  Left Atrium: Left atrial size was normal in size.  Right Atrium: Right atrial size was normal in size. Right atrial pressure is estimated at 10 mmHg.  Interatrial Septum: The atrial septum is grossly normal.  Pericardium: There is no evidence of pericardial effusion.  Mitral Valve: The mitral valve is normal in structure. Mitral valve regurgitation is trivial by color flow Doppler.  Tricuspid  Valve: The tricuspid valve is normal in structure. Tricuspid valve regurgitation is trivial by color flow Doppler.  Aortic Valve: The aortic valve is normal in structure. Aortic valve regurgitation was not visualized by color flow Doppler.  Pulmonic Valve: The pulmonic valve was grossly normal. Pulmonic valve regurgitation is trivial by color flow Doppler.  Aorta: The aortic root, ascending aorta and aortic arch are normal in size and structure. The aorta is normal in size and structure.  Patient Profile     Adrienne Camelrtisha McCoyis a 38 y.o.femalewith a hx of sickle cell anemia, TIA in 2018, known carotid artery disease (occluded left ICA), severe contrast allergy (anaphylaxis) and pork allergywho is being seen today for the evaluation of chest pain and elevated Hs Troponinat the request of Dr. Hyman HopesJegede, Internal Medicine.  Assessment & Plan    1. Chest Pain w/ Elevated HS  Troponin: in the stetting of sickle cell disease. Her admit EKG showed sinus tach w/ some ST/ T wave abnormalties. [place]. it showed NSR w/ no ST/ Twave abnormalities. Initial Hs Troponin trend here at [id number] ng/L>>871>>1,065>>1,157, but f/u level today shows downward trend, now at 279. Her echo is normal. EF normal. No WMAs. Suspect she may have small vessel involvement given her sickle cell. Currently w/o CP and dyspnea has improved. Not a candidate for contrast studies due to h/o anaphylaxis w/ contrast. We will plan outpatient NST post discharge to evaluate for ischemia.     For questions or updates, please contact [facility] Please consult [email or web address] for contact info under      Signed, Robbie LisBrittainy Simmons, PA-C  06/09/2019, 9:18 AM    History and all data above reviewed.  Patient examined.  I agree with the findings as above.  She feels much better.  She has no chest pain.  She is stronger.  She denies SOB.   The patient exam reveals COR:RRR  ,  Lungs: Clear  ,  Abd: Positive bowel sounds, no rebound no guarding, Ext no edema  .  All available labs, radiology testing, previous records reviewed. Agree with documented assessment and plan. Elevated trop:  I was able to review an old EKG and it did not demonstrate the EKG changes or QT prolongation that she had on the admission EKG.  I will check another EKG for her new baseline today.  I suspect that she had ischemia related to sickle cell crisis.  She is likely going home Sunday and [place] but agrees to stay in town for a [employer] Myoview which I am going to try to arrange for the hospital on Monday.    Adrienne Miller  12:23 PM  06/09/2019

## 2019-06-10 ENCOUNTER — Inpatient Hospital Stay (HOSPITAL_COMMUNITY): Payer: Medicare Other

## 2019-06-10 DIAGNOSIS — D638 Anemia in other chronic diseases classified elsewhere: Secondary | ICD-10-CM

## 2019-06-10 LAB — CBC WITH DIFFERENTIAL/PLATELET
Abs Immature Granulocytes: 0.03 10*3/uL (ref 0.00–0.07)
Basophils Absolute: 0 10*3/uL (ref 0.0–0.1)
Basophils Relative: 0 %
Eosinophils Absolute: 0.3 10*3/uL (ref 0.0–0.5)
Eosinophils Relative: 4 %
HCT: 27.3 % — ABNORMAL LOW (ref 36.0–46.0)
Hemoglobin: 9.7 g/dL — ABNORMAL LOW (ref 12.0–15.0)
Immature Granulocytes: 0 %
Lymphocytes Relative: 22 %
Lymphs Abs: 1.7 10*3/uL (ref 0.7–4.0)
MCH: 37.5 pg — ABNORMAL HIGH (ref 26.0–34.0)
MCHC: 35.5 g/dL (ref 30.0–36.0)
MCV: 105.4 fL — ABNORMAL HIGH (ref 80.0–100.0)
Monocytes Absolute: 0.6 10*3/uL (ref 0.1–1.0)
Monocytes Relative: 7 %
Neutro Abs: 5.2 10*3/uL (ref 1.7–7.7)
Neutrophils Relative %: 67 %
Platelets: 361 10*3/uL (ref 150–400)
RBC: 2.59 MIL/uL — ABNORMAL LOW (ref 3.87–5.11)
RDW: 16.4 % — ABNORMAL HIGH (ref 11.5–15.5)
WBC: 7.9 10*3/uL (ref 4.0–10.5)
nRBC: 1 % — ABNORMAL HIGH (ref 0.0–0.2)

## 2019-06-10 LAB — COMPREHENSIVE METABOLIC PANEL
ALT: 67 U/L — ABNORMAL HIGH (ref 0–44)
AST: 36 U/L (ref 15–41)
Albumin: 3.3 g/dL — ABNORMAL LOW (ref 3.5–5.0)
Alkaline Phosphatase: 54 U/L (ref 38–126)
Anion gap: 8 (ref 5–15)
BUN: 8 mg/dL (ref 6–20)
CO2: 18 mmol/L — ABNORMAL LOW (ref 22–32)
Calcium: 8.9 mg/dL (ref 8.9–10.3)
Chloride: 110 mmol/L (ref 98–111)
Creatinine, Ser: 0.57 mg/dL (ref 0.44–1.00)
GFR calc Af Amer: >60 mL/min (ref >60)
GFR calc non Af Amer: >60 mL/min (ref >60)
Glucose, Bld: 90 mg/dL (ref 70–99)
Potassium: 4.3 mmol/L (ref 3.5–5.1)
Sodium: 136 mmol/L (ref 135–145)
Total Bilirubin: 0.8 mg/dL (ref 0.3–1.2)
Total Protein: 7.3 g/dL (ref 6.5–8.1)

## 2019-06-10 LAB — TROPONIN I (HIGH SENSITIVITY): Troponin I (High Sensitivity): 210 ng/L (ref <18)

## 2019-06-10 MED ORDER — HYDROMORPHONE HCL 1 MG/ML IJ SOLN
2.0000 mg | INTRAMUSCULAR | Status: DC | PRN
  Administered 2019-06-10 – 2019-06-11 (×5): 2 mg via INTRAVENOUS
  Filled 2019-06-10 (×5): qty 2

## 2019-06-10 MED ORDER — POLYETHYLENE GLYCOL 3350 17 G PO PACK: 17.0000 g | PACK | Freq: Every day | ORAL | Status: DC | PRN

## 2019-06-10 MED ORDER — DOCUSATE SODIUM 100 MG PO CAPS: 100.0000 mg | ORAL_CAPSULE | Freq: Every day | ORAL | Status: DC | PRN

## 2019-06-10 NOTE — Progress Notes (Signed)
Patient ID: Adrienne Roverrtisha Stender, female   DOB: 1981/07/02, 38 y.o.   MRN: [id number] Subjective: Adrienne Miller is a 38 y.o. female with a hx of sickle cell anemia, TIA in 2018, known carotid artery disease (occluded left ICA), severe contrast allergy (anaphylaxis) and pork allergy  who was admitted yesterday for sickle cell pain crisis.  Patient is feeling a little better today, was seen by cardiologist this morning and the plan is to now do stress test as an inpatient tomorrow.  She is still having pain in her lower extremities and lower back but now around 6/10 intensity.  She denies any chest pain.  She does not have fever.  Her appetite is improved. She is ambulating well.  She denies any dizziness, shortness of breath, nausea, vomiting, diarrhea or constipation.   Objective:  Vital signs in last 24 hours:  Vitals:   06/10/19 0753 06/10/19 0757 06/10/19 1226 06/10/19 1304  BP: 101/63   102/73  Pulse: 90   86  Resp: 14 13 14 15   Temp: 98.2 F (36.8 C)   98.8 F (37.1 C)  TempSrc: Oral   Oral  SpO2: 100% 100%  100%  Weight:      Height:       Intake/Output from previous day:   Intake/Output Summary (Last 24 hours) at 06/10/2019 1427 Last data filed at 06/10/2019 0333 Gross per 24 hour  Intake 759.94 ml  Output -  Net 759.94 ml    Physical Exam: General: Alert, awake, oriented x3, in no acute distress.  HEENT: [place]/AT PEERL, EOMI Neck: Trachea midline,  no masses, no thyromegal,y no JVD, no carotid bruit OROPHARYNX:  Moist, No exudate/ erythema/lesions.  Heart: Regular rate and rhythm, without murmurs, rubs, gallops, PMI non-displaced, no heaves or thrills on palpation.  Lungs: Clear to auscultation, no wheezing or rhonchi noted. No increased vocal fremitus resonant to percussion  Abdomen: Soft, nontender, nondistended, positive bowel sounds, no masses no hepatosplenomegaly noted..  Neuro: No focal neurological deficits noted cranial nerves II through XII grossly intact. DTRs 2+  bilaterally upper and lower extremities. Strength 5 out of 5 in bilateral upper and lower extremities. Musculoskeletal: No warm swelling or erythema around joints, no spinal tenderness noted. Psychiatric: Patient alert and oriented x3, good insight and cognition, good recent to remote recall. Lymph node survey: No cervical axillary or inguinal lymphadenopathy noted.  Lab Results:  Basic Metabolic Panel:    Component Value Date/Time   NA 136 06/10/2019 0503   K 4.3 06/10/2019 0503   CL 110 06/10/2019 0503   CO2 18 (L) 06/10/2019 0503   BUN 8 06/10/2019 0503   CREATININE 0.57 06/10/2019 0503   GLUCOSE 90 06/10/2019 0503   CALCIUM 8.9 06/10/2019 0503   CBC:    Component Value Date/Time   WBC 7.9 06/10/2019 0503   HGB 9.7 (L) 06/10/2019 0503   HCT 27.3 (L) 06/10/2019 0503   PLT 361 06/10/2019 0503   MCV 105.4 (H) 06/10/2019 0503   NEUTROABS 5.2 06/10/2019 0503   LYMPHSABS 1.7 06/10/2019 0503   MONOABS 0.6 06/10/2019 0503   EOSABS 0.3 06/10/2019 0503   BASOSABS 0.0 06/10/2019 0503    Recent Results (from the past 240 hour(s))  SARS CORONAVIRUS 2 Nasal Swab Aptima Multi Swab     Status: None   Collection Time: 06/05/19 10:58 PM   Specimen: Aptima Multi Swab; Nasal Swab  Result Value Ref Range Status   SARS Coronavirus 2 NEGATIVE NEGATIVE Final    Comment: (NOTE) SARS-CoV-2 target  nucleic acids are NOT DETECTED. The SARS-CoV-2 RNA is generally detectable in upper and lower respiratory specimens during the acute phase of infection. Negative results do not preclude SARS-CoV-2 infection, do not rule out co-infections with other pathogens, and should not be used as the sole basis for treatment or other patient management decisions. Negative results must be combined with clinical observations, patient history, and epidemiological information. The expected result is Negative. Fact Sheet for Patients: SugarRoll.be Fact Sheet for Healthcare  Providers: https://www.woods-mathews.com/ This test is not yet approved or cleared by the [place] FDA and  has been authorized for detection and/or diagnosis of SARS-CoV-2 by FDA under an Emergency Use Authorization (EUA). This EUA will remain  in effect (meaning this test can be used) for the duration of the COVID-19 declaration under Section 56 4(b)(1) of the Act, 21 U.S.C. section 360bbb-3(b)(1), unless the authorization is terminated or revoked sooner. Performed at [facility], [place] [address] [place], [place] [address]   Culture, blood (routine x 2)     Status: None (Preliminary result)   Collection Time: 06/06/19  8:52 PM   Specimen: BLOOD RIGHT HAND  Result Value Ref Range Status   Specimen Description   Final    BLOOD RIGHT HAND Performed at [place] [address], [place], [place] [address]    Special Requests   Final    BOTTLES DRAWN AEROBIC ONLY Blood Culture adequate volume Performed at [place] [address], [place], [place] [address]    Culture   Final    NO GROWTH 4 DAYS Performed at [facility], [place] [address], [place], [place] [address]    Report Status PENDING  Incomplete  Culture, blood (routine x 2)     Status: None (Preliminary result)   Collection Time: 06/06/19  8:53 PM   Specimen: BLOOD RIGHT HAND  Result Value Ref Range Status   Specimen Description   Final    BLOOD RIGHT HAND Performed at [place] [address], [place], [place] [address]    Special Requests   Final    BOTTLES DRAWN AEROBIC ONLY Blood Culture adequate volume Performed at [place] [address] [place], [place] [address]    Culture   Final    NO GROWTH 4 DAYS Performed at [facility], [place] [address], [place], [place] [address]    Report Status PENDING  Incomplete  MRSA PCR Screening     Status: None   Collection Time: 06/09/19 10:28 AM    Specimen: Nasal Mucosa; Nasopharyngeal  Result Value Ref Range Status   MRSA by PCR NEGATIVE NEGATIVE Final    Comment:        The GeneXpert MRSA Assay (FDA approved for NASAL specimens only), is one component of a comprehensive MRSA colonization surveillance program. It is not intended to diagnose MRSA infection nor to guide or monitor treatment for MRSA infections. Performed at [facility], [place] [address], [place], [place] [address]     Studies/Results: Dg Chest 2 View  Result Date: 06/10/2019 CLINICAL DATA:  Chest congestion and pain. History of sickle cell disease. EXAM: CHEST - 2 VIEW COMPARISON:  June 08, 2019 FINDINGS: Cardiomediastinal silhouette is normal. Mediastinal contours appear intact. Mild linear peribronchial airspace opacities with lower lobe predominance. Osseous structures are without acute abnormality. Soft tissues are grossly normal. IMPRESSION: Mild linear peribronchial airspace opacities with lower lobe predominance. Findings may reflect early  mixed pattern pulmonary edema or atypical pneumonia. Electronically Signed   By: Ted Mcalpineobrinka  Dimitrova M.D.   On: 06/10/2019 13:11    Medications: Scheduled Meds: . aspirin EC  81 mg Oral Daily  . atorvastatin  80 mg Oral q1800  . diclofenac sodium  2 g Topical QID  . fondaparinux (ARIXTRA) injection  2.5 mg Subcutaneous Q0600  . hydroxyurea  1,500 mg Oral Daily  . L-glutamine  10 g Oral BID  . metoprolol tartrate  12.5 mg Oral BID  . pantoprazole  40 mg Oral Daily  . senna-docusate  1 tablet Oral BID  . voxelotor  1,500 mg Oral Q supper   Continuous Infusions: . ceFEPime (MAXIPIME) IV 2 g (06/10/19 0524)   PRN Meds:.acetaminophen, ALPRAZolam, docusate sodium, HYDROmorphone (DILAUDID) injection, naloxone **AND** sodium chloride flush, ondansetron (ZOFRAN) IV, polyethylene glycol  Consultants:  Cardiology  Procedures:  2D echocardiogram  Antibiotics:  IV cefepime >>>4    Assessment/Plan: Principal Problem:   Sickle cell pain crisis (HCC) Active Problems:   Carotid artery disease (HCC) - occluded left ICA   Allergic reaction to contrast dye   Acute coronary syndrome with high troponin (HCC)   Acute chest syndrome due to sickle cell crisis (HCC)   Anemia of chronic disease   Chronic pain syndrome   Chest congestion   Lung infiltrate on CT  1. ACS versus Demand Sschemia: Troponin is down to 210 today from 1,157 on 06/06/2019. Continue low-dose beta-blocker, aspirin and fondaparinux. Patient is hemodynamically stable.  We will continue to monitor very closely.patient is scheduled for Lexiscan Myoview NST tomorrow morning, 06/11/2019. Cardiology input much appreciated.  If stress test is negative patient may likely be discharged home. 2. Hb Sickle Cell Disease with crisis: Reduce IVF D5 .45% Saline to 50 cc/hour, discontinue weight based Dilaudid PCA and start IV Dilaudid 2 mg every 4 hours as needed, IV Toradol contraindicated because of concurrent use of full dose anticoagulation, Monitor vitals very closely, Re-evaluate pain scale regularly, 2 L of Oxygen by [place]. 3. Acute chest syndrome versus early multifocal pneumonia: continue IV cefepime.  Blood cultures are negative so far.   4. Leukocytosis: Patient on antibiotics, will continue. 5. Sickle Cell Anemia: Hemoglobin is stable, close to baseline per patient. We will continue to monitor. No clinical indication for blood transfusion at this time. 6. Chronic pain Syndrome: Continue home pain medication. Patient counseled. 7. Recent Mechanical Fall: No active bleeding or fracture. Patient is hemodynamically stable. Continue to monitor 8. History of TIA and Left Internal Carotid Artery Occlusion: Continue aspirin.   Code Status: Full Code Family Communication: N/A Disposition Plan: Not yet ready for discharge  Lashay Osborne  If 7PM-7AM, please contact night-coverage.  06/10/2019, 2:27 PM  LOS: 4 days

## 2019-06-11 ENCOUNTER — Inpatient Hospital Stay (HOSPITAL_COMMUNITY)
Admit: 2019-06-11 | Discharge: 2019-06-11 | Disposition: A | Discharge status: Home / Self Care | Payer: Medicare Other | Attending: Cardiology | Admitting: Cardiology

## 2019-06-11 DIAGNOSIS — R079 Chest pain, unspecified: Secondary | ICD-10-CM

## 2019-06-11 LAB — NM MYOCAR MULTI W/SPECT W/WALL MOTION / EF
Estimated workload: 1 METS
Exercise duration (min): 0 min
Exercise duration (sec): 0 s
MPHR: 185 {beats}/min
Peak HR: 157 {beats}/min
Percent HR: 84 %
Rest HR: 76 {beats}/min

## 2019-06-11 LAB — CULTURE, BLOOD (ROUTINE X 2)
Culture: NO GROWTH
Culture: NO GROWTH
Special Requests: ADEQUATE
Special Requests: ADEQUATE

## 2019-06-11 MED ORDER — TECHNETIUM TC 99M TETROFOSMIN IV KIT
10.0000 | PACK | Freq: Once | INTRAVENOUS | Status: AC | PRN
  Administered 2019-06-11: 10 via INTRAVENOUS

## 2019-06-11 MED ORDER — AMOXICILLIN-POT CLAVULANATE 875-125 MG PO TABS: 1.0000 | ORAL_TABLET | Freq: Two times a day (BID) | ORAL | 0 refills | Status: AC

## 2019-06-11 MED ORDER — REGADENOSON 0.4 MG/5ML IV SOLN
INTRAVENOUS | Status: AC
  Filled 2019-06-11: qty 5

## 2019-06-11 MED ORDER — METOPROLOL TARTRATE 25 MG PO TABS: 12.5000 mg | ORAL_TABLET | Freq: Two times a day (BID) | ORAL | 3 refills | Status: AC

## 2019-06-11 MED ORDER — ATORVASTATIN CALCIUM 80 MG PO TABS: 80.0000 mg | ORAL_TABLET | Freq: Every day | ORAL | 3 refills | Status: AC

## 2019-06-11 MED ORDER — REGADENOSON 0.4 MG/5ML IV SOLN
0.4000 mg | Freq: Once | INTRAVENOUS | Status: AC
  Administered 2019-06-11: 0.4 mg via INTRAVENOUS

## 2019-06-11 MED ORDER — TECHNETIUM TC 99M TETROFOSMIN IV KIT
30.0000 | PACK | Freq: Once | INTRAVENOUS | Status: AC | PRN
  Administered 2019-06-11: 30 via INTRAVENOUS

## 2019-06-11 NOTE — Discharge Instructions (Signed)
Community-Acquired Pneumonia, Adult °Pneumonia is an infection of the lungs. It causes swelling in the airways of the lungs. Mucus and fluid may also build up inside the airways. °One type of pneumonia can happen while a person is in a hospital. A different type can happen when a person is not in a hospital (community-acquired pneumonia).  °What are the causes? ° °This condition is caused by germs (viruses, bacteria, or fungi). Some types of germs can be passed from one person to another. This can happen when you breathe in droplets from the cough or sneeze of an infected person. °What increases the risk? °You are more likely to develop this condition if you: °· Have a long-term (chronic) disease, such as: °? Chronic obstructive pulmonary disease (COPD). °? Asthma. °? Cystic fibrosis. °? Congestive heart failure. °? Diabetes. °? Kidney disease. °· Have HIV. °· Have sickle cell disease. °· Have had your spleen removed. °· Do not take good care of your teeth and mouth (poor dental hygiene). °· Have a medical condition that increases the risk of breathing in droplets from your own mouth and nose. °· Have a weakened body defense system (immune system). °· Are a smoker. °· Travel to areas where the germs that cause this illness are common. °· Are around certain animals or the places they live. °What are the signs or symptoms? °· A dry cough. °· A wet (productive) cough. °· Fever. °· Sweating. °· Chest pain. This often happens when breathing deeply or coughing. °· Fast breathing or trouble breathing. °· Shortness of breath. °· Shaking chills. °· Feeling tired (fatigue). °· Muscle aches. °How is this treated? °Treatment for this condition depends on many things. Most adults can be treated at home. In some cases, treatment must happen in a hospital. Treatment may include: °· Medicines given by mouth or through an IV tube. °· Being given extra oxygen. °· Respiratory therapy. °In rare cases, treatment for very bad pneumonia  may include: °· Using a machine to help you breathe. °· Having a procedure to remove fluid from around your lungs. °Follow these instructions at home: °Medicines °· Take over-the-counter and prescription medicines only as told by your doctor. °? Only take cough medicine if you are losing sleep. °· If you were prescribed an antibiotic medicine, take it as told by your doctor. Do not stop taking the antibiotic even if you start to feel better. °General instructions ° °· Sleep with your head and neck raised (elevated). You can do this by sleeping in a recliner or by putting a few pillows under your head. °· Rest as needed. Get at least 8 hours of sleep each night. °· Drink enough water to keep your pee (urine) pale yellow. °· Eat a healthy diet that includes plenty of vegetables, fruits, whole grains, low-fat dairy products, and lean protein. °· Do not use any products that contain nicotine or tobacco. These include cigarettes, e-cigarettes, and chewing tobacco. If you need help quitting, ask your doctor. °· Keep all follow-up visits as told by your doctor. This is important. °How is this prevented? °A shot (vaccine) can help prevent pneumonia. Shots are often suggested for: °· People older than 36 years of age. °· People older than 36 years of age who: °? Are having cancer treatment. °? Have long-term (chronic) lung disease. °? Have problems with their body's defense system. °You may also prevent pneumonia if you take these actions: °· Get the flu (influenza) shot every year. °· Go to the dentist as   often as told.  Wash your hands often. If you cannot use soap and water, use hand sanitizer. Contact a doctor if:  You have a fever.  You lose sleep because your cough medicine does not help. Get help right away if:  You are short of breath and it gets worse.  You have more chest pain.  Your sickness gets worse. This is very serious if: ? You are an older adult. ? Your body's defense system is weak.  You  cough up blood. Summary  Pneumonia is an infection of the lungs.  Most adults can be treated at home. Some will need treatment in a hospital.  Drink enough water to keep your pee pale yellow.  Get at least 8 hours of sleep each night. This information is not intended to replace advice given to you by your health care provider. Make sure you discuss any questions you have with your health care provider. Document Released: 04/06/2008 Document Revised: 02/08/2019 Document Reviewed: 06/16/2018 [employer] Patient Education  2020 [employer]   Sickle Cell Anemia, Adult  Sickle cell anemia is a condition where your red blood cells are shaped like sickles. Red blood cells carry oxygen through the body. Sickle-shaped cells do not live as long as normal red blood cells. They also clump together and block blood from flowing through the blood vessels. This prevents the body from getting enough oxygen. Sickle cell anemia causes organ damage and pain. It also increases the risk of infection. Follow these instructions at home: Medicines  Take over-the-counter and prescription medicines only as told by your doctor.  If you were prescribed an antibiotic medicine, take it as told by your doctor. Do not stop taking the antibiotic even if you start to feel better.  If you develop a fever, do not take medicines to lower the fever right away. Tell your doctor about the fever. Managing pain, stiffness, and swelling  Try these methods to help with pain: ? Use a heating pad. ? Take a warm bath. ? Distract yourself, such as by watching TV. Eating and drinking  Drink enough fluid to keep your pee (urine) clear or pale yellow. Drink more in hot weather and during exercise.  Limit or avoid alcohol.  Eat a healthy diet. Eat plenty of fruits, vegetables, whole grains, and lean protein.  Take vitamins and supplements as told by your doctor. Traveling  When traveling, keep these with you: ? Your medical  information. ? The names of your doctors. ? Your medicines.  If you need to take an airplane, talk to your doctor first. Activity  Rest often.  Avoid exercises that make your heart beat much faster, such as jogging. General instructions  Do not use products that have nicotine or tobacco, such as cigarettes and e-cigarettes. If you need help quitting, ask your doctor.  Consider wearing a medical alert bracelet.  Avoid being in high places (high altitudes), such as mountains.  Avoid very hot or cold temperatures.  Avoid places where the temperature changes a lot.  Keep all follow-up visits as told by your doctor. This is important. Contact a doctor if:  A joint hurts.  Your feet or hands hurt or swell.  You feel tired (fatigued). Get help right away if:  You have symptoms of infection. These include: ? Fever. ? Chills. ? Being very tired. ? Irritability. ? Poor eating. ? Throwing up (vomiting).  You feel dizzy or faint.  You have new stomach pain, especially on the left side.  You have a an erection (priapism) that lasts more than 4 hours.  You have numbness in your arms or legs.  You have a hard time moving your arms or legs.  You have trouble talking.  You have pain that does not go away when you take medicine.  You are short of breath.  You are breathing fast.  You have a long-term cough.  You have pain in your chest.  You have a bad headache.  You have a stiff neck.  Your stomach looks bloated even though you did not eat much.  Your skin is pale.  You suddenly cannot see well. Summary  Sickle cell anemia is a condition where your red blood cells are shaped like sickles.  Follow your doctor's advice on ways to manage pain, food to eat, activities to do, and steps to take for safe travel.  Get medical help right away if you have any signs of infection, such as a fever. This information is not intended to replace advice given to you by your  health care provider. Make sure you discuss any questions you have with your health care provider. Document Released: 08/09/2013 Document Revised: 02/10/2019 Document Reviewed: 11/24/2016 [employer] Patient Education  2020 [employer].
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
## 2019-06-11 NOTE — Progress Notes (Addendum)
Progress Note  Patient Name: Adrienne Miller Date of Encounter: 06/11/2019  Primary Cardiologist: Minus Breeding, MD   Subjective   Saw patient down in nuclear medicine. Only complaint at this time is leg pain. No chest pain or shortness of breath.  Patient had a mild panic attack after administration of Lexiscan and began to briefly hyperventilate. We were able to calm patient down and have her take slow deep breaths. She was able to recover quickly without any medications.   Inpatient Medications    Scheduled Meds: . aspirin EC  81 mg Oral Daily  . atorvastatin  80 mg Oral q1800  . diclofenac sodium  2 g Topical QID  . fondaparinux (ARIXTRA) injection  2.5 mg Subcutaneous Q0600  . hydroxyurea  1,500 mg Oral Daily  . L-glutamine  10 g Oral BID  . metoprolol tartrate  12.5 mg Oral BID  . pantoprazole  40 mg Oral Daily  . senna-docusate  1 tablet Oral BID  . voxelotor  1,500 mg Oral Q supper   Continuous Infusions: . ceFEPime (MAXIPIME) IV 2 g (06/11/19 0552)   PRN Meds: acetaminophen, ALPRAZolam, docusate sodium, HYDROmorphone (DILAUDID) injection, naloxone **AND** sodium chloride flush, ondansetron (ZOFRAN) IV, polyethylene glycol   Vital Signs    Vitals:   06/10/19 1635 06/10/19 2009 06/10/19 2355 06/11/19 0414  BP: 104/67 105/71 (!) 92/56 (!) 87/57  Pulse: 91 97 79 73  Resp:  18 14 15   Temp: 98.8 F (37.1 C) 99 F (37.2 C) 98.7 F (37.1 C) 98.8 F (37.1 C)  TempSrc: Oral Oral Oral Oral  SpO2: 100% 99% 99% 97%  Weight:      Height:       No intake or output data in the 24 hours ending 06/11/19 1014 Last 3 Weights 06/05/2019  Weight (lbs) 118 lb  Weight (kg) 53.524 kg      Telemetry    Sinus rhythm - Personally Reviewed  ECG   No new tracing. - Personally Reviewed  Physical Exam   GEN: Thin African-American female in no acute distress.   Neck: Supple. Cardiac: RRR. No murmurs, rubs, or gallops.  Respiratory: Clear to auscultation bilaterally. GI:  Soft, non-tender, non-distended. Bowel sounds present. MS: No edema; No deformity. Skin: Warm and dry. Neuro:  Nonfocal  Psych: Normal affect   Labs    High Sensitivity Troponin:   Recent Labs  Lab 06/06/19 1842 06/06/19 2053 06/06/19 2212 06/09/19 0552 06/10/19 0503  TROPONINIHS 871* 1,065* 1,157* 279* 210*      Cardiac EnzymesNo results for input(s): TROPONINI in the last 168 hours. No results for input(s): TROPIPOC in the last 168 hours.   Chemistry Recent Labs  Lab 06/07/19 0141 06/08/19 0519 06/10/19 0503  NA 133* 134* 136  K 5.0 3.9 4.3  CL 104 106 110  CO2 21* 21* 18*  GLUCOSE 117* 119* 90  BUN 11 12 8   CREATININE 0.63 0.70 0.57  CALCIUM 8.2* 8.5* 8.9  PROT 7.0 7.0 7.3  ALBUMIN 3.5 3.3* 3.3*  AST 73* 59* 36  ALT 72* 83* 67*  ALKPHOS 57 55 54  BILITOT 2.8* 1.9* 0.8  GFRNONAA >60 >60 >60  GFRAA >60 >60 >60  ANIONGAP 8 7 8      Hematology Recent Labs  Lab 06/06/19 1656 06/08/19 0519 06/10/19 0503  WBC 15.5* 14.3* 7.9  RBC 2.67* 2.42* 2.59*  HGB 9.9* 9.1* 9.7*  HCT 27.9* 25.5* 27.3*  MCV 104.5* 105.4* 105.4*  MCH 37.1* 37.6* 37.5*  MCHC 35.5  35.7 35.5  RDW 19.8* 18.6* 16.4*  PLT 277 292 361    BNPNo results for input(s): BNP, PROBNP in the last 168 hours.   DDimer No results for input(s): DDIMER in the last 168 hours.   Radiology    Dg Chest 2 View  Result Date: 06/10/2019 CLINICAL DATA:  Chest congestion and pain. History of sickle cell disease. EXAM: CHEST - 2 VIEW COMPARISON:  June 08, 2019 FINDINGS: Cardiomediastinal silhouette is normal. Mediastinal contours appear intact. Mild linear peribronchial airspace opacities with lower lobe predominance. Osseous structures are without acute abnormality. Soft tissues are grossly normal. IMPRESSION: Mild linear peribronchial airspace opacities with lower lobe predominance. Findings may reflect early mixed pattern pulmonary edema or atypical pneumonia. Electronically Signed   By: Ted Mcalpineobrinka  Dimitrova  M.D.   On: 06/10/2019 13:11    Cardiac Studies   Echocardiogram 06/07/2019: Impression: 1. The left ventricle has normal systolic function, with an ejection fraction of 55-60%. The cavity size was normal. Left ventricular diastolic parameters were normal.  2. The right ventricle has normal systolic function. The cavity was normal. There is no increase in right ventricular wall thickness.  3. The aorta is normal in size and structure.  4. The aortic root, ascending aorta and aortic arch are normal in size and structure.  5. The atrial septum is grossly normal.  Patient Profile   Adrienne Hickrtisha McCoyis a 38 y.o.femalewith a hx of sickle cell anemia, TIA in 2018, known carotid artery disease (occluded left ICA), severe contrast allergy (anaphylaxis) and pork allergywho is being seen today for the evaluation of chest pain and elevated Hs Troponinat the request of Dr. Hyman HopesJegede, Internal Medicine.  Assessment & Plan    Chest Pain with Elevated Troponin - In the setting of sickle cell disease.  - Admit EKG showed sinus tachycardia with some ST/T wave abnormalities (diffuse T wave inversions/biphasic T waves). [place] [place] [place] showed NSR w/ no ST/ Twave abnormalities.  - High-sensitivity troponin elevated at 603 >> 871 >> 1,065 >> 1,157 >> 279 >> 210.  - Echo showed normal EF with no wall motion abnormalities. - Patient denies any angina today. - Likely demand ischemia in setting of sickle cell crisis. - Lexiscan Myoview performed today. Results pending.   For questions or updates, please contact [facility] Please consult [email or web address] for contact info under        Signed, Corrin ParkerCallie E Goodrich, PA-C  06/11/2019, 10:14 AM    Personally seen and examined. Agree with above.   Awaiting results of NUC stress. Patient is fairly comfortable in [facility] waiting for transport back to [facility]. Alert, lungs clear, heart reg CP with troponin elevation  - Prelim look at  stress imaging shows suboptimal gut attenuation. Difficult to comment confidently on inferior wall distribution. Thankfully, ECHO showed normal overall function.   - I agree with prior assessment that troponin elevation is likely secondary to demand ischemia process in the setting of sickle cell.   Donato SchultzMark , MD

## 2020-10-11 IMAGING — DX PORTABLE CHEST - 1 VIEW
1 series · 1 of 1 positions shown · non-contrast
Comparison: None.

CLINICAL DATA: Sickle cell patient, presentation concerning for
sickle cell pain crisis

EXAM:
PORTABLE CHEST 1 VIEW

[chest ap]
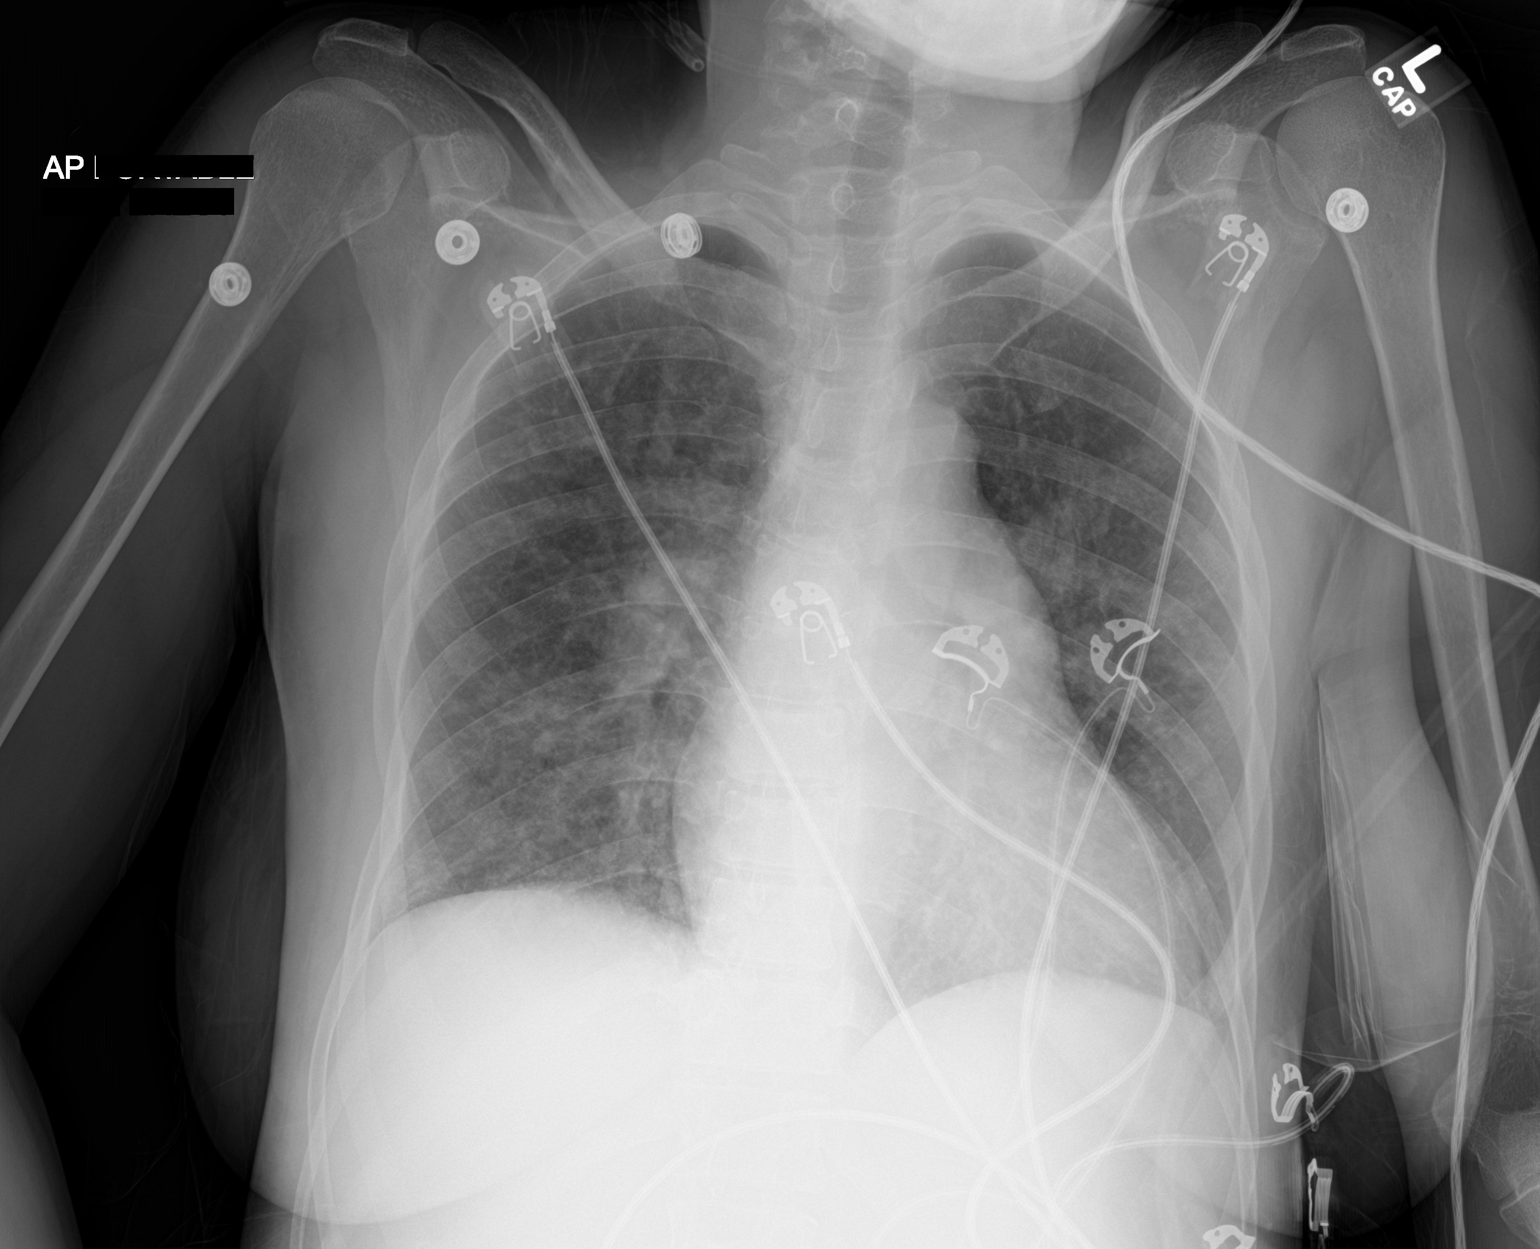

[1 of 1 positions shown; findings below may reference images not displayed]

FINDINGS: Multifocal areas of airspace opacity throughout both lungs most
pronounced in the right base and retrocardiac region. No features of
edema, pneumothorax, or effusion. Pulmonary vascularity is normally
distributed. The cardiomediastinal contours are unremarkable. No
acute osseous or soft tissue abnormality.
IMPRESSION: Multifocal areas of airspace opacity throughout both lungs,
concerning for multifocal pneumonia or acute chest syndrome

## 2020-10-14 IMAGING — DX CHEST - 2 VIEW
2 series · 2 of 2 positions shown · non-contrast
Comparison: 06/07/2019

CLINICAL DATA: Sickle cell flare up, chest pain, infiltrate

EXAM:
CHEST - 2 VIEW

[chest pa]
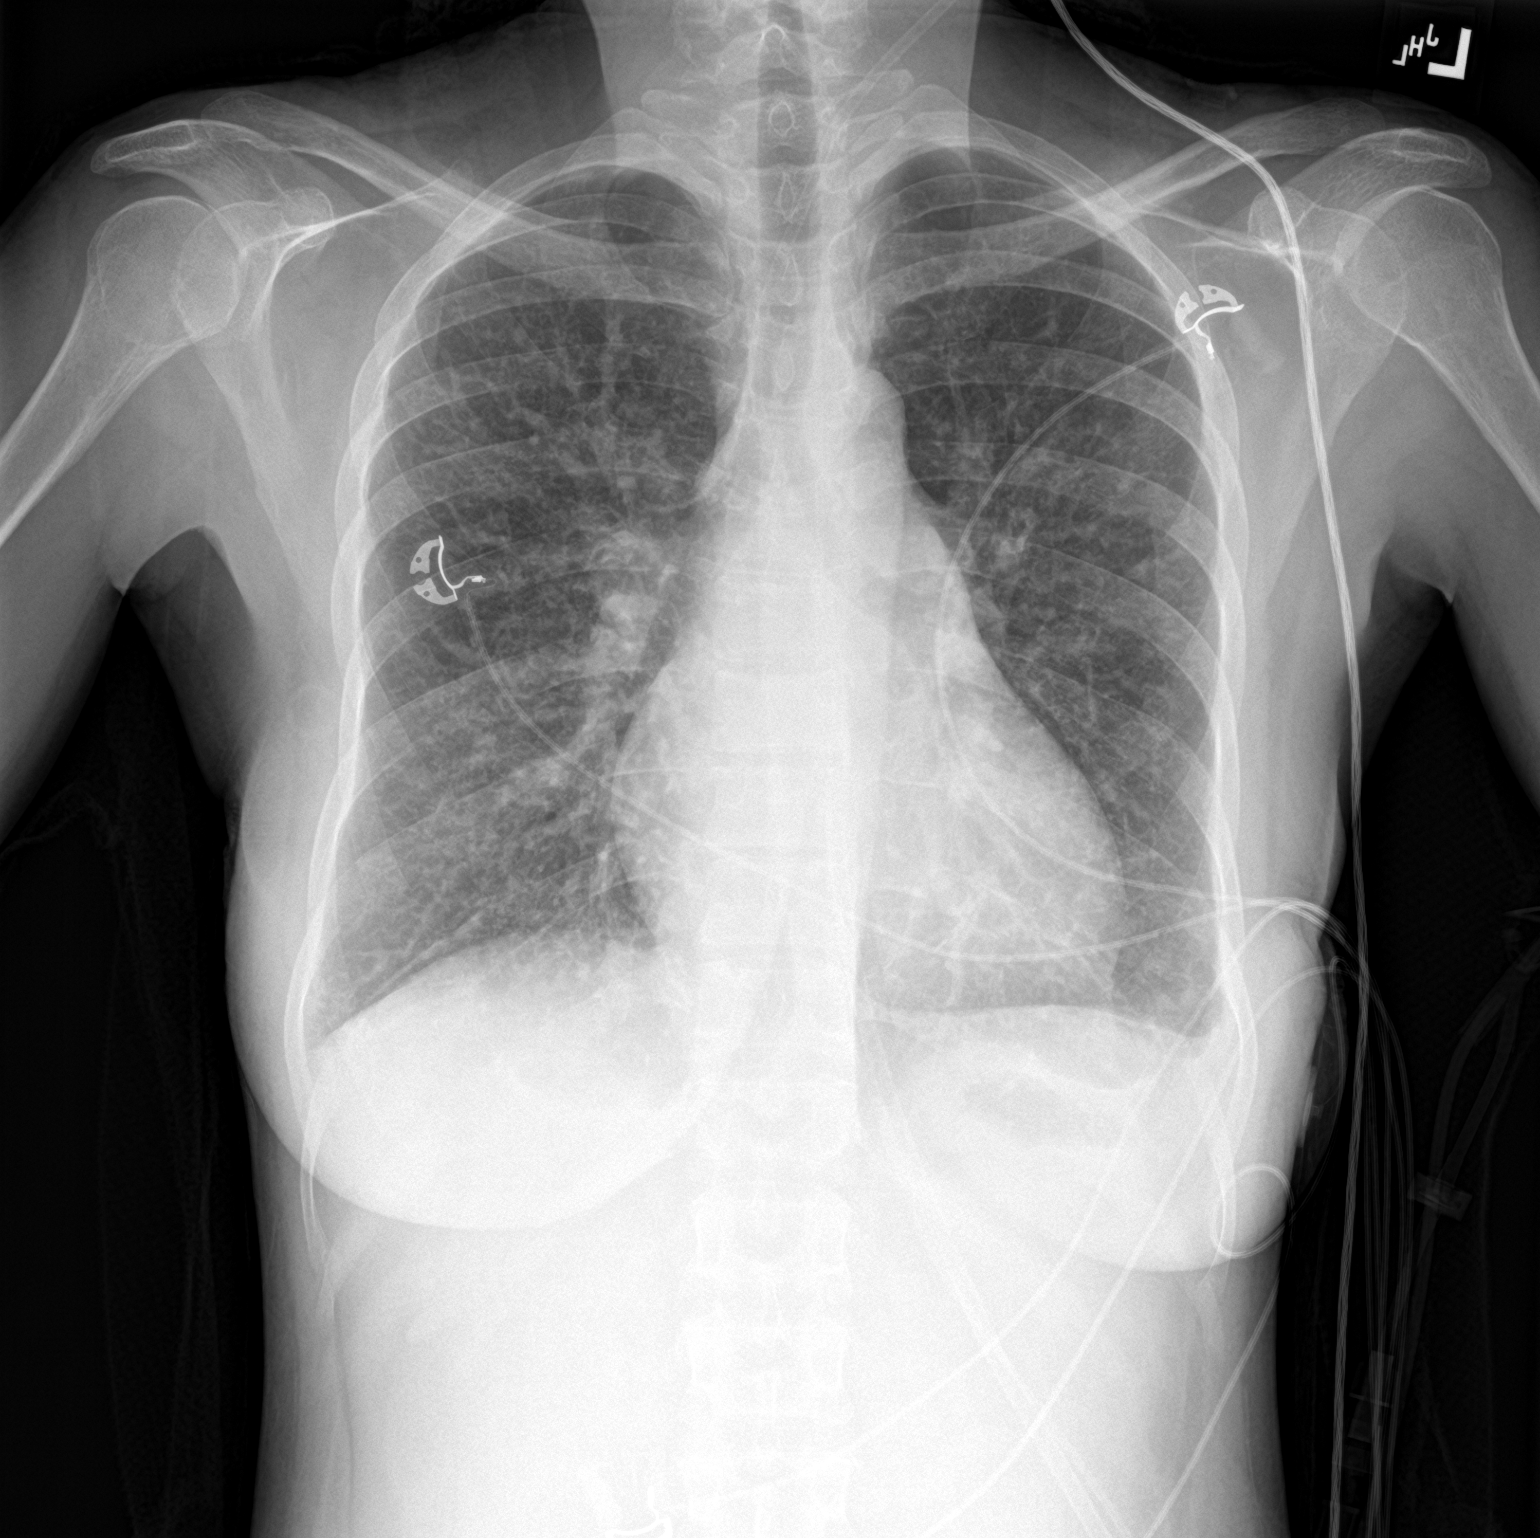

[chest lat]
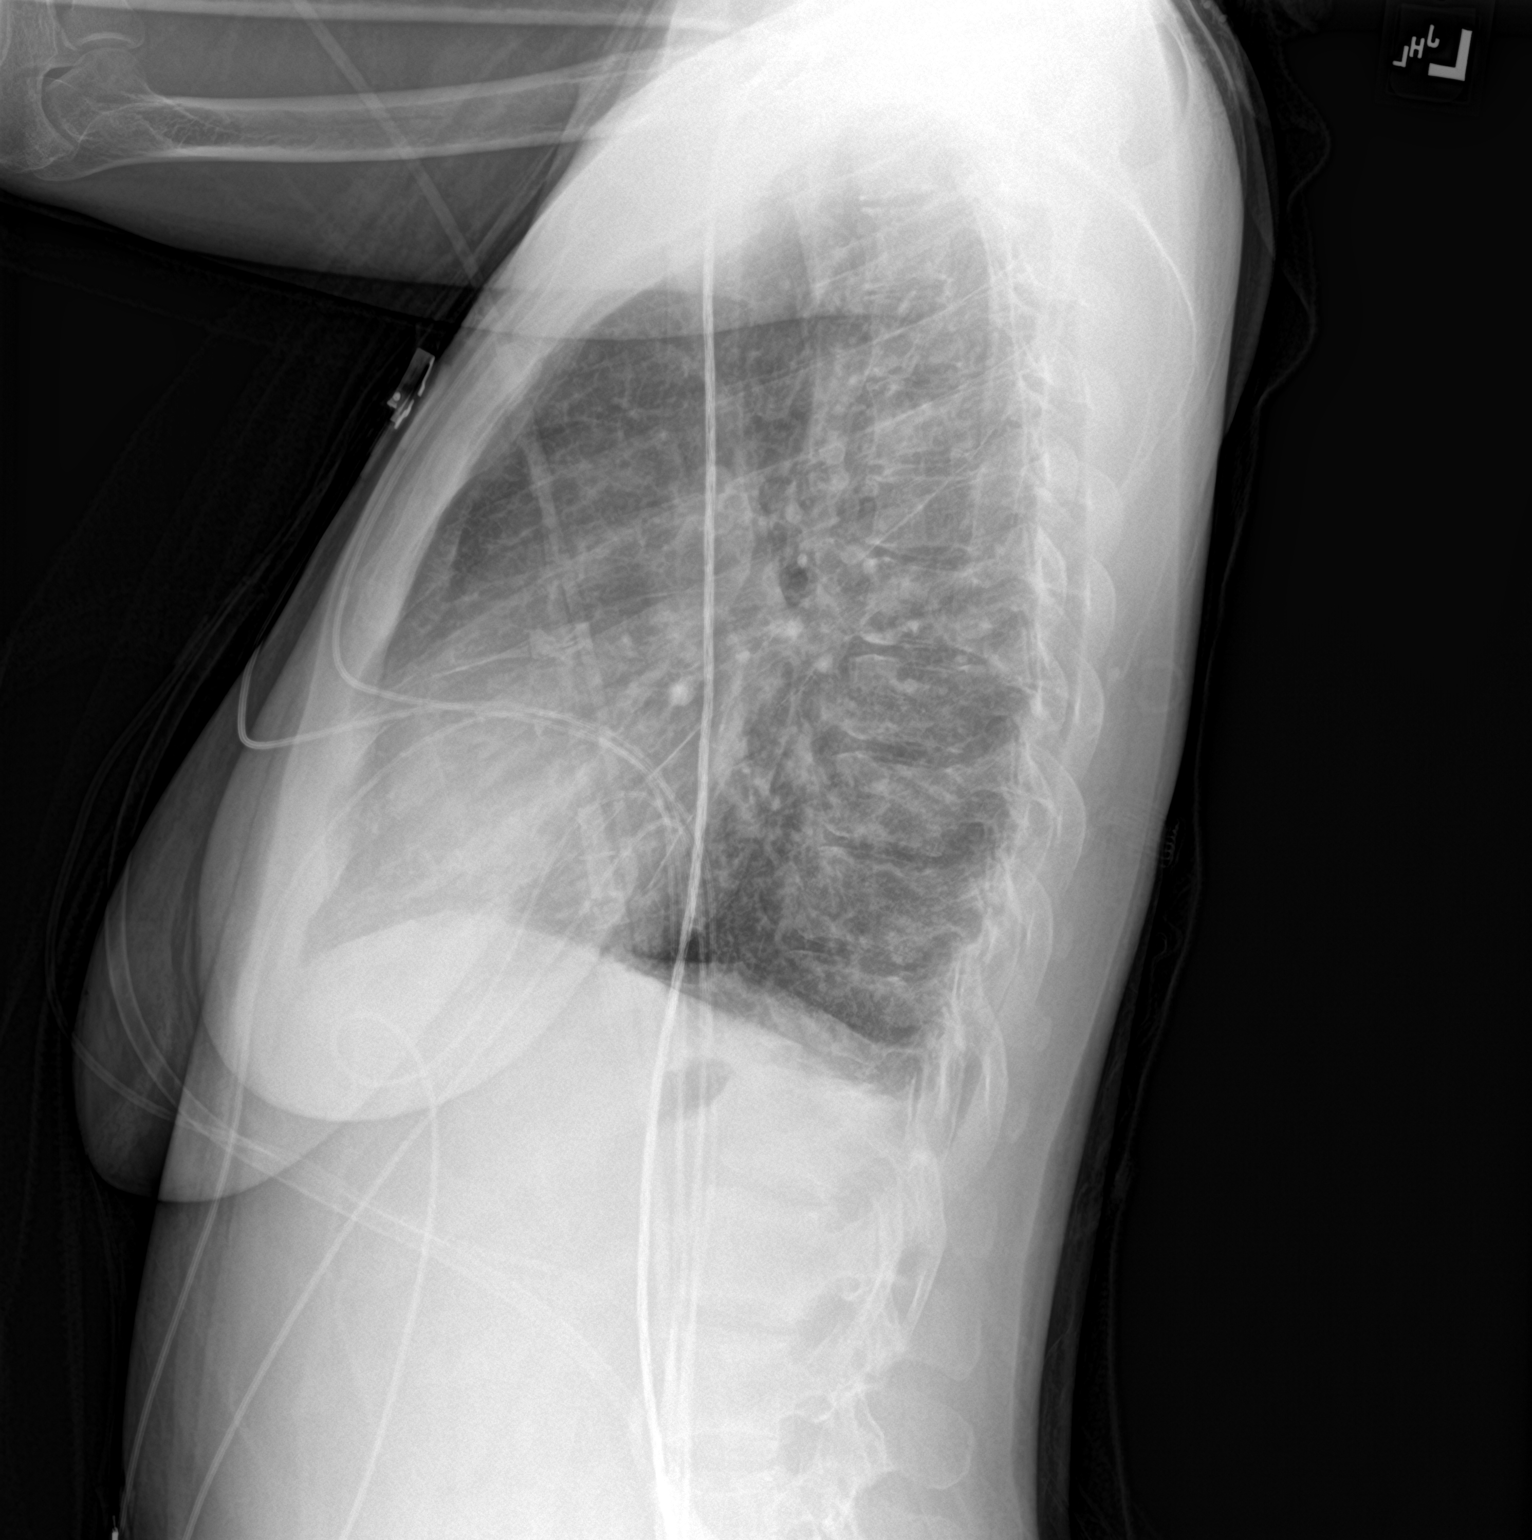

[2 of 2 positions shown; findings below may reference images not displayed]

FINDINGS: Enlargement of cardiac silhouette with pulmonary vascular
congestion.

Accentuated markings throughout both lungs again seen, question
diffuse edema or atypical infection.

Tiny LEFT pleural effusion.

No pneumothorax or acute osseous findings.
IMPRESSION: Enlargement of cardiac silhouette and vascular congestion consistent
with history of sickle cell disease.

Diffuse interstitial infiltrates which could represent edema or
atypical infection, not significantly changed since 06/07/2019.
